# Patient Record
Sex: Male | Born: 1945 | Race: White | Hispanic: No | Marital: Married | Smoking: Current every day smoker
Health system: Southern US, Community
[De-identification: ages and names within clinical notes are randomized; demographics above are authoritative.]

## PROBLEM LIST (undated history)

## (undated) DIAGNOSIS — D759 Disease of blood and blood-forming organs, unspecified: Secondary | ICD-10-CM

## (undated) DIAGNOSIS — Z86718 Personal history of other venous thrombosis and embolism: Secondary | ICD-10-CM

## (undated) DIAGNOSIS — K91850 Pouchitis: Secondary | ICD-10-CM

## (undated) DIAGNOSIS — K2270 Barrett's esophagus without dysplasia: Secondary | ICD-10-CM

## (undated) DIAGNOSIS — R918 Other nonspecific abnormal finding of lung field: Principal | ICD-10-CM

## (undated) DIAGNOSIS — K219 Gastro-esophageal reflux disease without esophagitis: Secondary | ICD-10-CM

## (undated) DIAGNOSIS — J449 Chronic obstructive pulmonary disease, unspecified: Secondary | ICD-10-CM

## (undated) DIAGNOSIS — F419 Anxiety disorder, unspecified: Secondary | ICD-10-CM

## (undated) DIAGNOSIS — D469 Myelodysplastic syndrome, unspecified: Secondary | ICD-10-CM

## (undated) DIAGNOSIS — R0602 Shortness of breath: Secondary | ICD-10-CM

## (undated) DIAGNOSIS — N400 Enlarged prostate without lower urinary tract symptoms: Secondary | ICD-10-CM

## (undated) DIAGNOSIS — E538 Deficiency of other specified B group vitamins: Secondary | ICD-10-CM

## (undated) HISTORY — DX: Pouchitis: K91.850

## (undated) HISTORY — PX: KNEE SURGERY: SHX244

## (undated) HISTORY — DX: Other nonspecific abnormal finding of lung field: R91.8

## (undated) HISTORY — DX: Deficiency of other specified B group vitamins: E53.8

## (undated) HISTORY — DX: Personal history of other venous thrombosis and embolism: Z86.718

## (undated) HISTORY — DX: Barrett's esophagus without dysplasia: K22.70

## (undated) HISTORY — PX: COLECTOMY: SHX59

## (undated) HISTORY — DX: Myelodysplastic syndrome, unspecified: D46.9

---

## 1997-12-27 ENCOUNTER — Ambulatory Visit (HOSPITAL_COMMUNITY): Admission: RE | Admit: 1997-12-27 | Discharge: 1997-12-27 | Payer: Self-pay | Admitting: Internal Medicine

## 2001-02-06 ENCOUNTER — Inpatient Hospital Stay (HOSPITAL_COMMUNITY): Admission: EM | Admit: 2001-02-06 | Discharge: 2001-02-08 | Payer: Self-pay | Admitting: Emergency Medicine

## 2001-02-06 ENCOUNTER — Encounter: Payer: Self-pay | Admitting: Emergency Medicine

## 2001-12-01 ENCOUNTER — Encounter: Payer: Self-pay | Admitting: Urology

## 2001-12-01 ENCOUNTER — Encounter: Admission: RE | Admit: 2001-12-01 | Discharge: 2001-12-01 | Payer: Self-pay | Admitting: Urology

## 2002-02-23 HISTORY — PX: ABDOMINOPERINEAL PROCTOCOLECTOMY: SUR8

## 2002-06-09 ENCOUNTER — Ambulatory Visit (HOSPITAL_COMMUNITY): Admission: RE | Admit: 2002-06-09 | Discharge: 2002-06-09 | Payer: Self-pay | Admitting: Gastroenterology

## 2002-06-09 ENCOUNTER — Encounter (INDEPENDENT_AMBULATORY_CARE_PROVIDER_SITE_OTHER): Payer: Self-pay | Admitting: *Deleted

## 2002-11-19 ENCOUNTER — Emergency Department (HOSPITAL_COMMUNITY): Admission: EM | Admit: 2002-11-19 | Discharge: 2002-11-19 | Payer: Self-pay

## 2006-02-02 ENCOUNTER — Ambulatory Visit: Payer: Self-pay | Admitting: Gastroenterology

## 2006-03-10 ENCOUNTER — Ambulatory Visit: Payer: Self-pay | Admitting: Gastroenterology

## 2006-04-20 ENCOUNTER — Ambulatory Visit: Payer: Self-pay | Admitting: Gastroenterology

## 2006-10-11 ENCOUNTER — Ambulatory Visit: Payer: Self-pay | Admitting: Internal Medicine

## 2006-11-22 ENCOUNTER — Ambulatory Visit (HOSPITAL_COMMUNITY): Admission: RE | Admit: 2006-11-22 | Discharge: 2006-11-22 | Payer: Self-pay | Admitting: Gastroenterology

## 2006-11-22 ENCOUNTER — Encounter: Payer: Self-pay | Admitting: Gastroenterology

## 2006-11-22 ENCOUNTER — Ambulatory Visit: Payer: Self-pay | Admitting: Gastroenterology

## 2006-11-22 HISTORY — PX: COLONOSCOPY: SHX174

## 2006-11-30 ENCOUNTER — Ambulatory Visit: Payer: Self-pay | Admitting: Gastroenterology

## 2006-12-15 ENCOUNTER — Ambulatory Visit: Payer: Self-pay | Admitting: Gastroenterology

## 2007-01-05 ENCOUNTER — Ambulatory Visit: Payer: Self-pay | Admitting: Gastroenterology

## 2007-01-11 ENCOUNTER — Encounter (HOSPITAL_COMMUNITY): Admission: RE | Admit: 2007-01-11 | Discharge: 2007-02-10 | Payer: Self-pay | Admitting: Oncology

## 2007-01-11 ENCOUNTER — Ambulatory Visit (HOSPITAL_COMMUNITY): Payer: Self-pay | Admitting: Oncology

## 2007-02-21 ENCOUNTER — Encounter (HOSPITAL_COMMUNITY): Admission: RE | Admit: 2007-02-21 | Discharge: 2007-02-23 | Payer: Self-pay | Admitting: Oncology

## 2007-03-01 ENCOUNTER — Ambulatory Visit (HOSPITAL_COMMUNITY): Payer: Self-pay | Admitting: Oncology

## 2007-03-09 ENCOUNTER — Ambulatory Visit: Payer: Self-pay | Admitting: Gastroenterology

## 2007-04-19 ENCOUNTER — Ambulatory Visit: Payer: Self-pay | Admitting: Gastroenterology

## 2007-07-20 ENCOUNTER — Ambulatory Visit: Payer: Self-pay | Admitting: Gastroenterology

## 2007-10-25 DIAGNOSIS — D72819 Decreased white blood cell count, unspecified: Secondary | ICD-10-CM | POA: Insufficient documentation

## 2007-10-25 DIAGNOSIS — D696 Thrombocytopenia, unspecified: Secondary | ICD-10-CM | POA: Insufficient documentation

## 2007-10-25 DIAGNOSIS — K5190 Ulcerative colitis, unspecified, without complications: Secondary | ICD-10-CM | POA: Insufficient documentation

## 2007-10-25 DIAGNOSIS — D126 Benign neoplasm of colon, unspecified: Secondary | ICD-10-CM

## 2007-10-25 DIAGNOSIS — D649 Anemia, unspecified: Secondary | ICD-10-CM

## 2007-10-25 DIAGNOSIS — Z87898 Personal history of other specified conditions: Secondary | ICD-10-CM

## 2007-10-25 DIAGNOSIS — Z86718 Personal history of other venous thrombosis and embolism: Secondary | ICD-10-CM | POA: Insufficient documentation

## 2007-10-25 DIAGNOSIS — R197 Diarrhea, unspecified: Secondary | ICD-10-CM

## 2007-10-27 DIAGNOSIS — F17200 Nicotine dependence, unspecified, uncomplicated: Secondary | ICD-10-CM | POA: Insufficient documentation

## 2008-01-30 ENCOUNTER — Ambulatory Visit: Payer: Self-pay | Admitting: Gastroenterology

## 2008-09-18 ENCOUNTER — Ambulatory Visit: Payer: Self-pay | Admitting: Gastroenterology

## 2008-09-18 DIAGNOSIS — K91850 Pouchitis: Secondary | ICD-10-CM

## 2008-10-12 ENCOUNTER — Encounter: Payer: Self-pay | Admitting: Urgent Care

## 2008-10-15 ENCOUNTER — Encounter (INDEPENDENT_AMBULATORY_CARE_PROVIDER_SITE_OTHER): Payer: Self-pay | Admitting: *Deleted

## 2009-09-04 ENCOUNTER — Encounter (INDEPENDENT_AMBULATORY_CARE_PROVIDER_SITE_OTHER): Payer: Self-pay

## 2009-10-18 ENCOUNTER — Telehealth (INDEPENDENT_AMBULATORY_CARE_PROVIDER_SITE_OTHER): Payer: Self-pay

## 2009-12-27 ENCOUNTER — Ambulatory Visit (HOSPITAL_COMMUNITY)
Admission: RE | Admit: 2009-12-27 | Discharge: 2009-12-27 | Payer: Self-pay | Source: Home / Self Care | Admitting: Urology

## 2010-02-13 ENCOUNTER — Telehealth (INDEPENDENT_AMBULATORY_CARE_PROVIDER_SITE_OTHER): Payer: Self-pay

## 2010-02-23 DIAGNOSIS — K2270 Barrett's esophagus without dysplasia: Secondary | ICD-10-CM

## 2010-02-23 DIAGNOSIS — K91850 Pouchitis: Secondary | ICD-10-CM

## 2010-02-23 HISTORY — DX: Barrett's esophagus without dysplasia: K22.70

## 2010-02-23 HISTORY — DX: Pouchitis: K91.850

## 2010-03-03 ENCOUNTER — Encounter: Payer: Self-pay | Admitting: Gastroenterology

## 2010-03-03 ENCOUNTER — Ambulatory Visit
Admission: RE | Admit: 2010-03-03 | Discharge: 2010-03-03 | Payer: Self-pay | Source: Home / Self Care | Attending: Gastroenterology | Admitting: Gastroenterology

## 2010-03-04 ENCOUNTER — Encounter: Payer: Self-pay | Admitting: Gastroenterology

## 2010-03-07 ENCOUNTER — Encounter: Payer: Self-pay | Admitting: Internal Medicine

## 2010-03-07 LAB — CONVERTED CEMR LAB
Alkaline Phosphatase: 50 units/L (ref 39–117)
BUN: 14 mg/dL (ref 6–23)
Eosinophils Absolute: 0 10*3/uL (ref 0.0–0.7)
Eosinophils Relative: 0 % (ref 0–5)
Glucose, Bld: 125 mg/dL — ABNORMAL HIGH (ref 70–99)
HCT: 34.4 % — ABNORMAL LOW (ref 39.0–52.0)
Lymphs Abs: 0.8 10*3/uL (ref 0.7–4.0)
MCHC: 31.4 g/dL (ref 30.0–36.0)
MCV: 113.5 fL — ABNORMAL HIGH (ref 78.0–100.0)
Platelets: 113 10*3/uL — ABNORMAL LOW (ref 150–400)
Sodium: 137 meq/L (ref 135–145)
Total Bilirubin: 0.6 mg/dL (ref 0.3–1.2)
Total Protein: 7.7 g/dL (ref 6.0–8.3)
WBC: 2.5 10*3/uL — ABNORMAL LOW (ref 4.0–10.5)

## 2010-03-14 ENCOUNTER — Ambulatory Visit (HOSPITAL_COMMUNITY)
Admission: RE | Admit: 2010-03-14 | Discharge: 2010-03-14 | Payer: Self-pay | Source: Home / Self Care | Attending: Internal Medicine | Admitting: Internal Medicine

## 2010-03-14 HISTORY — PX: ESOPHAGOGASTRODUODENOSCOPY: SHX1529

## 2010-03-14 HISTORY — PX: ILEOSCOPY: SHX311

## 2010-03-25 NOTE — Op Note (Signed)
NAME:  Mark Morrison, Mark Morrison                ACCOUNT NO.:  [id number]  MEDICAL RECORD NO.:  [id number]          PATIENT TYPE:  AMB  LOCATION:  DAY                           FACILITY:  [facility]  PHYSICIAN:  R. Roetta Sessions, M.D. DATE OF BIRTH:  [birth year]  DATE OF PROCEDURE:  03/14/2010 DATE OF DISCHARGE:                              OPERATIVE REPORT   PROCEDURE:  Ileoscopy with ileal biopsies followed esophagogastroduodenoscopy with esophageal biopsies.  INDICATIONS FOR PROCEDURE:  A 65 year old gentleman with ileal pouch- anal anastomosis after proctocolectomy for ulcerative colitis.  She has had intermittent chronic diarrhea, history of pouchitis.  He has had transient response to antibiotics over the years.  He is now pancytopenic.  He has had some weight loss recently as well.  Recent course of Augmentin and Flagyl through our office, it has been associated with transient improvement in his symptoms.  Ileoscopy and biopsy is appropriate followed by EGD now being done to further evaluate his symptoms.  Risks, benefits, limitations, alternatives, and imponderables have been discussed previously and again did at the bedside.  All parties questions were answered, all parties agreeable.  PROCEDURE NOTE:  O2 saturation, blood pressure, pulse, and respirations monitored throughout the entirety of both procedures.  CONSCIOUS SEDATION:  Versed 5 mg IV and Demerol 125 mg IV in divided doses.  Cetacaine spray for topical pharyngeal anesthesia.  INSTRUMENT:  Pentax video chip system.  ILEO FINDINGS:  Digital rectal exam revealed no abnormalities. Endoscopic findings:  Prep was adequate.  Anastomosis with the ileum just inside the anal verge.  There was geographic ulceration noted in the ileal mucosa at the level of patch extending proximal to 4 cm.  The ileal mucosa appeared entirely normal.  Advanced the scope probably up to 40 cm.  From this level, scope was withdrawn.  All  previously mentioned mucosal surfaces were again seen.  Again the inflation was localized to the pouch.  Please see photos.  Biopsies were taken.  The patient tolerated this procedure well and was prepared for EGD. Examination of the tubular esophagus revealed salmon-colored epithelium coming up until the level of 27 cm from incisors consistent with Barrett esophagus.  There was no esophagitis.  No neoplasm.  Tubular esophagus was patent through the EG junction.  STOMACH:  Gastric cavity was emptied and insufflated well with air. Thorough examination of the gastric mucosa including retroflexed proximal stomach esophagogastric junction demonstrated no abnormalities. Pylorus was patent, easily traversed.  Examination of the bulb second portion revealed diffusely eroded duodenal bulbar mucosa with some nodularity appear to be benign process, see photos.  Otherwise D1-D2 appeared unremarkable.  Therapeutic/diagnostic maneuvers performed. Segmental biopsies from 29, 31, 33, and 35 cm from the incisors were taken of the esophageal mucosa.  The patient tolerated both procedures well and was reacted in endoscopy.  IMPRESSION: 1. Salmon-colored epithelium coming out into the distal esophagus     consistent with Barrett esophagus, status post biopsy. 2. Small hiatal hernia, otherwise normal stomach. 3. Erosive duodenitis (bulb).  Ileoscopy findings, geographic     ulcerations of the ileal pouch consistent with pouchitis, more  proximal distal ileum mucosa appeared entirely normal, status post     biopsy.  RECOMMENDATIONS: 1. We will follow up on path. 2. Further management and recommendations to follow in the very near     future.     Mark Morrison, M.D.     RMR/MEDQ  D:  03/14/2010  T:  03/14/2010  Job:  [id number]  cc:   Soyla Murphy. [name], M.D. Fax: [phone]  Electronically Signed by Lorrin Goodell M.D. on 03/25/2010 01:39:02 PM

## 2010-03-25 NOTE — Progress Notes (Signed)
Summary: phone note/diarrhea  Phone Note Call from Patient   Caller: Patient Summary of Call: Pt's wife called and said she feels he is having a "bacterial" infection. He is having diarrhea alot. She wanted to know if  Dr. Darrick Penna would call something in. I told her i would pass the request to Dr. Darrick Penna. She has not made an appt, because  he likes for her to come when he comes, and when she tries to get appt when she is off, there are none available or Dr. Darrick Penna is not here that day. Please advise! Initial call taken by: Cloria Spring LPN,  October 18, 2009 4:14 PM     Appended Document: phone note/diarrhea Please call pt. He needs to be seen. Call in AUGmentin 500 mg two times a day for 5 days and Flagyl 500 mg three times a day for 5 days, rfx0. Pt may continue to use Hyomax. Needs f/u in 4-6 weeks regardless of provider avaialibility. Med side effects include loose stools, red rash, nausea, or vomtiing. Avoid alcohol, even cough syrup.  Appended Document: phone note/diarrhea Pt's wife informed. Rx called and LMOM @ [facility].

## 2010-03-25 NOTE — Letter (Signed)
Summary: Recall Office Visit  [facility]  [address]   [place], [place] [address]   Phone: [phone]  Fax: [phone]      September 04, 2009   JOHNATHEN TESTA [address] [place], [place]  [address] October 13, 1945   Dear Mr. GRASSIA,   According to our records, it is time for you to schedule a follow-up office visit with [place].   At your convenience, please call [phone] to schedule an office visit. If you have any questions, concerns, or feel that this letter is in error, we would appreciate your call.   Sincerely,    Hendricks Limes LPN  [facility] Ph: [phone]   Fax: [phone]

## 2010-03-27 NOTE — Assessment & Plan Note (Signed)
Summary: CBC AN CMP YEARLY/LAW   Visit Type:  Follow-up Visit Primary Care Provider:  Merri Brunette, M.D.  CC:  diarrhea.  History of Present Illness: Mark Morrison is here for a routine yearly follow-up. Last seen July 2010. Scheduled for yearly CBC, CMP. This is due now. Has hx of UC requiring proctocolectomy with ileal anastamosis and anal pouch creation in 2004.  Has hx of chronic pouchitis; Pouchitis/Ileitis found on ileoscopy-2008. Usually has bouts with diarrhea every 6 mos, requiring short course of abx. Has had best outcome with Augmentin/Flagyl. Per last note, may require rounds of abx as needed  if increased diarrhea. Baseline stools 3-5 per day. Missed yearly office visit, called into office with c/o increased diarrhea.  States diarrhea has exacerbated 10-15X in whole day. 2-3 at night. No blood noted. Denies abdominal pain. Afebrile. Is not taking hyomax anymore, stopped in the summer. 188 July 2010, today 169. No loss of appetite. Denies N/V.   Current Medications (verified): 1)  B12 Injection Every 3 Months 2)  Adult Aspirin Ec Low Strength 81 Mg Tbec (Aspirin) .... Take 1 Tablet By Mouth Once A Day 3)  Multi-Day Vitamins  Tabs (Multiple Vitamin) .... Take 1 Tablet By Mouth Once A Day 4)  Fish Oil  Oil (Fish Oil) .... Take 1 Tablet By Mouth Once A Day  Allergies (verified): No Known Drug Allergies  Past History:  Past Medical History: Last updated: 09/18/2008 Ulcerative Colitis with IPAA 2004 Pouchitis/Ileitis on ileoscopy-2008 B12 Deficiency BPH Hx:DVT/PE  Past Surgical History: Last updated: 10/25/2007 LEFT KNEE SURGERY PROCTOCOLECTOMY 2004  Review of Systems General:  Denies fever, chills, and anorexia. Eyes:  Denies blurring, irritation, and discharge. ENT:  Denies sore throat, hoarseness, and difficulty swallowing. CV:  Denies chest pains and syncope. Resp:  Denies dyspnea at rest and wheezing. GI:  Complains of diarrhea; denies difficulty swallowing, pain on  swallowing, nausea, abdominal pain, bloody BM's, and black BMs. GU:  Denies urinary burning and urinary frequency. MS:  Denies joint pain / LOM, joint swelling, and joint stiffness. Derm:  Denies rash, itching, and dry skin. Neuro:  Denies weakness and syncope. Psych:  Denies depression and anxiety. Endo:  Denies cold intolerance and heat intolerance. Heme:  Denies bruising and bleeding.  Vital Signs:  Patient profile:   65 year old male Height:      67 inches Weight:      169 pounds BMI:     26.56 Temp:     97.6 degrees F oral Pulse rate:   88 / minute BP sitting:   110 / 80  (left arm) Cuff size:   regular  Vitals Entered By: Hendricks Limes LPN (March 03, 2010 8:32 AM)  Physical Exam  General:  Well developed, well nourished, no acute distress. Head:  Normocephalic and atraumatic. Eyes:  sclera without icterus Mouth:  No deformity or lesions, dentition normal. Lungs:  Clear throughout to auscultation. Heart:  Regular rate and rhythm; no murmurs, rubs,  or bruits. Abdomen:  +BS, well-healed surgical scars, no rebound or guarding, no tenderness or distention. No HSM. Msk:  Symmetrical with no gross deformities. Normal posture. Extremities:  No clubbing, cyanosis, edema or deformities noted. Neurologic:  Alert and  oriented x4;  grossly normal neurologically. Skin:  Intact without significant lesions or rashes. Psych:  Alert and cooperative. Normal mood and affect.  Impression & Recommendations:  Problem # 1:  POUCHITIS (ICD-26.72)  65 year old male with hx of UC requiriing proctocolectomy and ileal anastamosis, pouch creation.  chronic pouchitis requiring short course of abx, responds best to Augmentin and Flagyl. Usually goes about 6 mos without any flares. Now with increased stool 10-15 per day. No brbpr, no melena. No abdominal pain, N/V. Wt slightly down at 169 today, was 188 July 2010. Due for CBC, CMP  Augmentin 500 mg by mouth two times a day X 5 days Flagyl 500 mg  by mouth three times a day X 5 days CBC, CMP Return in 3 mos for wt check, evaluation Instructed to call office if frequency of stools does not resolve with abx. Pt aware  Orders: Est. Patient Level II (45409)  Other Orders: T-CBC w/Diff ([id number]) T-Comprehensive Metabolic Panel [phone])

## 2010-03-27 NOTE — Progress Notes (Signed)
Summary: rx request  Phone Note Call from Patient Call back at Home Phone [phone]   Caller: Spouse Summary of Call: pts wife called- pt is having diarrhea again and is requesting Rx for augmentin and flagyl called to [facility]. pt has ov appt for 03/03/10. informed pts wife that he did not return in Sept, 2011 as [facility] recommended and was not sure if we would be able to call in Rx or not. please advise Initial call taken by: Hendricks Limes LPN,  February 13, 2010 12:09 PM     Appended Document: rx request we will hold of on prescribing abx, as pt hasn't been seen as requested. if abdominal pain, fever, worsening of diarrhea,needs to go to ED.  Appended Document: rx request pts wife aware

## 2010-03-27 NOTE — Letter (Signed)
Summary: TCS/EGD ORDER  TCS/EGD ORDER   Imported By: Ave Filter 03/07/2010 09:10:00  _____________________________________________________________________  External Attachment:    Type:   Image     Comment:   External Document

## 2010-03-27 NOTE — Miscellaneous (Signed)
Summary: Orders Update  Clinical Lists Changes  Orders: Added new Test order of T-CBC w/Diff [phone]) - Signed Added new Test order of T-Comprehensive Metabolic Panel [id number]) - Signed

## 2010-04-22 ENCOUNTER — Ambulatory Visit (INDEPENDENT_AMBULATORY_CARE_PROVIDER_SITE_OTHER): Payer: Self-pay | Admitting: Urgent Care

## 2010-04-22 ENCOUNTER — Encounter: Payer: Self-pay | Admitting: Urgent Care

## 2010-04-22 DIAGNOSIS — K91850 Pouchitis: Secondary | ICD-10-CM

## 2010-04-22 DIAGNOSIS — K2270 Barrett's esophagus without dysplasia: Secondary | ICD-10-CM

## 2010-04-22 DIAGNOSIS — R197 Diarrhea, unspecified: Secondary | ICD-10-CM

## 2010-05-01 NOTE — Assessment & Plan Note (Signed)
Summary: ov w/us in about 4 weeks/barretts esophagus   Vital Signs:  Patient profile:   65 year old male Height:      67 inches Weight:      170 pounds BMI:     26.72 Temp:     97.3 degrees F oral Pulse rate:   88 / minute BP sitting:   128 / 78  (left arm)  Vitals Entered By: Carolan Clines LPN (April 22, 2010 11:04 AM)   Visit Type:  Follow-up Visit Primary Care Provider:  Merri Brunette, MD  Chief Complaint:  UC/Barrett's.  History of Present Illness: 65 year old gentleman who had ulcerative colitis and subsequently underwent a proctocolectomy with ileal anastomosis and anal pouch creation.  He does have a history of pouchitis and ileitis.  He has done very well with periodic use of Augmentin. Recent colonoscopy & EGD showed pouchitis, erosive duodenitis & Barrett's esophagus.  c/o diarrhea evey 2 hrs, 2-3 episodes at night.   Denies any abd pain, fever or chills.  Denies any rectal bleeding or melena.  Denies anorexia or wt loss.  Completed canasa x 1 mo-no help.  Tried imodium-no help.  Uses hyoscyamine as needed.  Current Medications (verified): 1)  B12 Injection Every 3 Months 2)  Adult Aspirin Ec Low Strength 81 Mg Tbec (Aspirin) .... Take 1 Tablet By Mouth Once A Day 3)  Multi-Day Vitamins  Tabs (Multiple Vitamin) .... Take 1 Tablet By Mouth Once A Day 4)  Fish Oil  Oil (Fish Oil) .... Take 1 Tablet By Mouth Once A Day 5)  Flomax 0.4 Mg Caps (Tamsulosin Hcl) .... Take One At Bedtime 6)  Hyomax 0.125 Mg Tabs (Hyoscyamine Sulfate) .Marland Kitchen.. 1 By Mouth Qid As Needed Diarrhea 7)  Metronidazole 500 Mg Tabs (Metronidazole) .Marland Kitchen.. 1 By Mouth Three Times A Day X 10 Days  Allergies (verified): No Known Drug Allergies  Past History:  Past Medical History: Ulcerative Colitis with IPAA 2004 Pouchitis/Ileitis on ileoscopy-02/2010 B12 Deficiency BPH Hx:DVT/PE Barrett's esophagus on EGD 02/2010  Past Surgical History: Reviewed history from 10/25/2007 and no changes required. LEFT KNEE  SURGERY PROCTOCOLECTOMY 2004  Review of Systems      See HPI General:  Denies fever, chills, sweats, anorexia, fatigue, weakness, malaise, weight loss, and sleep disorder. CV:  Denies chest pains, angina, palpitations, syncope, dyspnea on exertion, orthopnea, PND, peripheral edema, and claudication. Resp:  Denies dyspnea at rest, dyspnea with exercise, cough, sputum, wheezing, coughing up blood, and pleurisy. GI:  See HPI. GU:  Denies urinary burning, blood in urine, urinary frequency, urinary hesitancy, nocturnal urination, and urinary incontinence. MS:  Denies joint pain / LOM, joint swelling, joint stiffness, joint deformity, low back pain, muscle weakness, muscle cramps, muscle atrophy, leg pain at night, leg pain with exertion, and shoulder pain / LOM hand / wrist pain (CTS). Derm:  Denies rash, itching, dry skin, hives, moles, warts, and unhealing ulcers. Psych:  Denies depression, anxiety, memory loss, suicidal ideation, hallucinations, paranoia, phobia, and confusion. Heme:  Denies bruising, bleeding, and enlarged lymph nodes.  Physical Exam  General:  Well developed, well nourished, no acute distress. Head:  Normocephalic and atraumatic. Eyes:  sclera without icterus Mouth:  No deformity or lesions, dentition normal. Neck:  Supple; no masses or thyromegaly. Heart:  Regular rate and rhythm; no murmurs, rubs,  or bruits. Abdomen:  normal bowel sounds, without guarding, without rebound, no masses, and no hepatomegally or splenomegaly.   Msk:  Symmetrical with no gross deformities. Normal posture. Extremities:  No clubbing, cyanosis, edema or deformities noted. Neurologic:  Alert and  oriented x4;  grossly normal neurologically. Skin:  Intact without significant lesions or rashes. Psych:  Alert and cooperative. Normal mood and affect.   Impression & Recommendations:  Problem # 1:  POUCHITIS (ICD-569.71) 65 y/o caucasian male w/ hx UC & recent ileocolonoscopy w/ pouchitis w/  minimal response to canasa.  Trial flagyl 500mg  three times a day x 10 days to see if improvement in diarrhea.   Orders: Est. Patient Level III ([address]109)  Problem # 2:  BARRETTS ESOPHAGUS (ICD-530.85) Found on recent EGD.  On PPI. Repeat EGD 1 yr  Problem # 3:  DIARRHEA (ICD-787.91) Secondary to UC/pouchitis  Patient Instructions: 1)  EGD 02/2011 FU Barrett's 2)  Continue protonix 40mg /day 3)  Please schedule a follow-up appointment in 6months or sooner if diarrhea no better Prescriptions: METRONIDAZOLE 500 MG TABS (METRONIDAZOLE) 1 by mouth three times a day x 10 days  #30 x 0   Entered and Authorized by:   Joselyn Arrow FNP-BC   Signed by:   Joselyn Arrow FNP-BC on 04/22/2010   Method used:   Electronically to        [facility]  [address] (retail)       [address]       [place], [place]  [address]       Ph: [phone]       Fax: [phone]   RxID:   [id number] HYOMAX 0.125 MG TABS (HYOSCYAMINE SULFATE) 1 by mouth qid as needed diarrhea  #120 x 5   Entered and Authorized by:   Joselyn Arrow FNP-BC   Signed by:   Joselyn Arrow FNP-BC on 04/22/2010   Method used:   Electronically to        [facility]  [address] (retail)       [address]       [place], [place]  [address]       Ph: [phone]       Fax: [phone]   RxID:   [id number]    Orders Added: 1)  Est. Patient Level III [60630]  Appended Document: ov w/us in about 4 weeks/barretts esophagus reminder in epic

## 2010-07-08 NOTE — Assessment & Plan Note (Signed)
NAMEMarland Kitchen  Mark Morrison, Mark Morrison                 CHART#:  [id number]   DATE:                                   DOB:  January 25, 1946   PROBLEM:  1. B12 deficiency, followed by Dr. Ladona Horns. [place].  2. History of ileal pouch with anal anastomosis at [facility] in      2004.  3. Chronic loose stools, likely secondary to pouchitis.  4. Ileostomy performed in September 2008, which showed chronic colitis      with surface erosion at the ileoanal anastomosis.  In the terminal      ileum there was no cryptitis or crypt abscess.  An ulcer at the      anastomosis showed crypt distortion.  The anal pouch showed chronic      pouchitis.  5. Benign prostatic hypertrophy.  6. History of pulmonary embolism and deep venous thrombosis.   SUBJECTIVE:  Mark Morrison is a 65 year old male who has improved somewhat  with Pentasa.  The Pentasa is too expensive.  He is having eight to nine  bowel movements a day sometimes.  They are small volume.  The codeine  makes his right leg hurt and his toes numb.  He really has only been  taking his hyoscyamine three times daily.  He rarely has nocturnal  stools.  He denies any fever.  His appetite is good.   MEDICATIONS:  1. Terazosin 2 mg twice daily.  2. Levsin three times daily.  3. Baby aspirin daily.  4. B12 injections.  5. Multivitamin.  6. Fish oil.  7. Pentasa four times daily, but his prescription is out.   PHYSICAL EXAMINATION:  VITAL SIGNS:  Weight 178 pounds (up 2 pounds  since October 2008.)  Height is 5 feet 7 inches, temperature 97.9  degrees, blood pressure 112/78, pulse 88.  GENERAL:  He is in no apparent distress.  Alert and oriented x4.  LUNGS:  Clear to auscultation bilaterally.  CARDIOVASCULAR:  A regular rhythm.  No murmurs.  ABDOMEN:  Bowel sounds present, soft, nontender, nondistended.   ASSESSMENT:  Mark Morrison is a 65 year old male with chronic pouchitis.  He  responded somewhat to Augmentin, but may have a better response from his  pouchitis to  Flagyl.  It is also less expensive. Thank you for allowing  me to see Mark Morrison in consultation.   RECOMMENDATIONS:  1. Add Flagyl 500 mg three times daily for 10 days.  2. He is also given a prescription for Terazosin 2 mg, one p.o. twice      daily.  He is given no refills.  3. He is asked to see his primary care physician for additional      refills.  4. Refilled his hyoscyamine.  5. Return patient visit in one month.       Kassie Mends, M.D.  Electronically Signed     SM/MEDQ  D:  03/09/2007  T:  03/09/2007  Job:  [id number]   cc:   Mark Morrison, M.D.

## 2010-07-08 NOTE — Assessment & Plan Note (Signed)
NAMEMarland Kitchen  Mark Morrison, Mark Morrison                 CHART#:  [id number]   DATE:  11/30/2006                       DOB:  09/16/1945   REASON FOR VISIT:  Follow up of procedure.   SUBJECTIVE:  The patient is a pleasant, 65 year old gentleman with a  history of ulcerative colitis, status post proctocolectomy with ileal  pouch-anal anastomosis.  He presents with complaints of persistent  diarrhea.  He had a flexible sigmoidoscopy on November 22, 2006 by Dr.  Kassie Mends, which revealed a few scattered ulcers in the neo-terminal  ileum, ulcerative erythematous lesions at the ileoanal anastomosis,  shallow ulcers in the anal pouch.  The biopsies came back with chronic  ileitis, chronic colitis, and chronic pouchitis.  The patient is having  4-5 stools every evening beginning around 3 p.m. to midnight.  He then,  again, gets up around 2 a.m. and 4 a.m. every night to have another  loose stool.  He denies any blood in the stool.  He is not having any  abdominal pain.  No nausea or vomiting or heartburn.  His appetite is  good.   CURRENT MEDICATIONS:  see updated list.  He has been on the mesalamine  enemas just for 3 evenings.   ALLERGIES:  No known drug allergies.   PHYSICAL EXAM:  VITAL SIGNS:  Weight 172.5, temperature 97.8, blood  pressure 122/80, pulse 84.  GENERAL:  A pleasant, well-nourished well-developed Caucasian male in no  acute distress.  SKIN:  Warm and dry no jaundice.  HEENT:  Sclerae nonicteric, oropharyngeal mucosa moist and pink.  ABDOMEN:  Positive bowel sounds.  Abdomen soft, nontender, nondistended.  No organomegaly or masses.  No rebound tenderness or guarding.   IMPRESSION:  Mark Morrison is a 65 year old gentleman status post ileal  pouch-anal anastomosis for ulcerative colitis.  He has chronic diarrhea  which is persisting.  Recent flexible sigmoidoscopy as outlined above.  His diarrhea may be due to chronic pouchitis versus ileitis due to IBD.  Given that he has a finding in  his ileum, a question of the possibility  of Crohn's.  I have discussed biopsies with Dr. Kassie Mends.  The  following plan has been outlined.   PLAN:  1. We will hold on oral mesalamine therapy at this time.  We will      continue mesalamine enemas for at least two to three weeks.  We      will have him return to the office at that point in time, and      discuss for any change in his therapy.  We will go ahead and check      a B12 and CMET for baseline.  2. Office visit with Dr. Kassie Mends in 4 weeks.  If no significant      improvement in the next couple of weeks, could consider a trial of      Pentasa.       Tana Coast, P.A.  Electronically Signed     Kassie Mends, M.D.  Electronically Signed    LL/MEDQ  D:  11/30/2006  T:  12/01/2006  Job:  [id number]

## 2010-07-08 NOTE — H&P (Signed)
NAME:  Mark Morrison, Mark Morrison                ACCOUNT NO.:  [id number]   MEDICAL RECORD NO.:  [id number]         PATIENT TYPE:  AMB   LOCATION:                                FACILITY:  [facility]   PHYSICIAN:  Kassie Mends, M.D.      DATE OF BIRTH:  1945/03/30   DATE OF ADMISSION:  10/01/2006  DATE OF DISCHARGE:  LH                              HISTORY & PHYSICAL   CHIEF COMPLAINT:  Chronic diarrhea, status post proctocolectomy.   HISTORY OF PRESENT ILLNESS:  Mark Morrison is a 65 year old Caucasian male  who has history of ulcerative colitis and status post partial colectomy  with ileal pouch and ileal anal anastomosis.  He initially came to see  Dr. Cira Servant December 2007.  He has been having chronic diarrhea off and on  since that time.  He has used a couple courses of Flagyl and amoxicillin  which do seem to help temporarily.  He has not been on antibiotics for 6  months now.  He tells me usually in the evenings between 4:00-8:00 p.m.  he could have upwards of 10 bowel movements a day.  He says his symptoms  are worse with ice cream, gravy, and chocolate cake.  He denies any  anorexia.  Denies any rectal bleeding or melena.  His weight is down 6  pounds in the last 9 months.  He was scheduled for a flexible  sigmoidoscopy and began to feel better around February this year.  He  therefore cancelled the procedure.   PAST MEDICAL HISTORY:  He has history of ulcerative colitis status post  proctocolectomy with ileal pouch and ileal anal anastomosis.  He has  history of BPH, pulmonary embolus and DVT.  He has history of multiple  colon polyps prior to surgery.  He has had left knee surgery.   CURRENT MEDICATIONS:  1. Terazosin 2 mg b.i.d.  2. Levsin 0.125 mg q.i.d.  3. Tincture of Opium 1 mL t.i.d.  4. Baby aspirin 81 mg daily.   ALLERGIES:  NO KNOWN DRUG ALLERGIES.   FAMILY HISTORY:  No known family history of colorectal carcinoma or  chronic liver or GI problems.   SOCIAL HISTORY:  He is married,  has two children.  He is retired from  [employer].  He denies any alcohol or drug use.  He does  smoke a pack of cigarettes daily.   REVIEW OF SYSTEMS:  See HPI, otherwise negative.   PHYSICAL EXAMINATION:  VITAL SIGNS:  Weight 178 pounds, height 67  inches, temperature 97.8, blood pressure 120/78, pulse 60.  GENERAL:  Mark Morrison is a well-developed, well-nourished Caucasian male  in no acute distress.  He is accompanied by his wife today.  HEENT:  Sclerae are clear, nonicteric.  Conjunctivae pink.  Oropharynx  pink and moist without lesions.  NECK:  Supple without any mass or thyromegaly.  CHEST:  Heart regular rate and normal S1-S2 no murmurs, clicks, rubs or  gallops.  LUNGS:  Clear to auscultation bilaterally.  ABDOMEN:  Positive bowel sounds x4.  No bruits auscultated.  He has  vertical  and horizontal well-healed previous scars from abdominal  surgeries.  Abdomen is soft, nontender, nondistended without palpable  mass or hepatosplenomegaly.  No rebound tenderness or guarding.  EXTREMITIES:  With clubbing.  There is no edema.  SKIN:  Pink, warm and dry without any rash or jaundice.   IMPRESSION:  Mark Morrison is a 65 year old Caucasian male who is status  post ileal pouch anal anastomosis given history of ulcerative colitis  and multiple polyps.  He has had chronic diarrhea which has not  responded to tincture of opium and/or Levsin.  He does have intermittent  relief with antibiotics making it possible that he could have bacterial  overgrowth and/or pouchitis.  He is going to require further evaluation  by Dr. Cira Servant.   PLAN:  1. Flexible sigmoidoscopy with Dr. Cira Servant in the near future.  I have      discussed the procedure including risks and benefits which include      but are not limited to bleeding, infection, perforation, drug      reaction.  He agrees with plan and consent will be obtained.  2. Tincture of opium 1 mm q.i.d. p.r.n. diarrhea in quantity       sufficient for a month with no refills.  3. He will have two tap water enemas and endoscopy prior to flexible      sigmoidoscopy.  4. He can continue Levsin p.r.n.  5. He has amoxicillin and Flagyl on hand, if necessary.   ADDENDDUM: MANUS:  Consider treatment for bacterial overgrowth if Flex  Sig unremarkable and low fat diet. Low likelihood of celiac sprue.      Lorenza Burton, N.P.      Kassie Mends, M.D.  Electronically Signed    KJ/MEDQ  D:  10/11/2006  T:  10/12/2006  Job:  [id number]   cc:   Soyla Murphy. [name], M.D.  Fax: [phone]

## 2010-07-08 NOTE — Assessment & Plan Note (Signed)
NAMEMarland Kitchen  Mark Morrison, Mark Morrison                 CHART#:  [id number]   DATE:  01/30/2008                       DOB:  1945/05/04   PROBLEM LIST:  1. Chronic loose stool following proctocolectomy in 2004, with      evidence of pouchitis and ileitis on ileoscopy in September 2008.  2. B12 deficiency.  3. Benign prostatic hypertrophy.  4. History of pulmonary embolus and deep vein thrombosis.   SUBJECTIVE:  The patient is a 65 year old gentleman who presents for a  return visit.  He was last seen on 07/20/2007.  He has a history of  chronic loose stools with history of chronic pouchitis, which has  responded to both Augmentin and Flagyl previously.  He has also been on  other medications including Pentasa which he felt was too expensive,  Rowasa enemas, and Canasa suppositories, which he felt did not help.  He  has taken codeine in the past for loose stools with result as well as  hyoscyamine.  He tells me since his last office visit, he has used  Augmentin on 3 occasions.  Overall, he has been feeling well.  He states  if he eats certain things he may have increased number of stools.  Generally, he has about 3-5 stools a day.  He may have about 3 during  the day and sometimes 2-3 at night.  Denies any nocturnal diarrhea.  Denies any abdominal pain.  No melena or rectal bleeding.  Stools are  generally soft to loose.  He takes hyoscyamine about 2 times daily.  He  notes it does seem to help.   CURRENT MEDICATIONS:  1. B12 every 3 months.  2. Aspirin 81 mg daily.  3. Multivitamin daily.  4. Fish oil daily.  5. Hyoscyamine 0.125 mg sublingual b.i.d.  6. Terazosin 5 mg daily.   ALLERGIES:  No known drug allergies.   PHYSICAL EXAMINATION:  VITAL SIGNS:  Weight 187, up 7 pounds; height 5  feet 7 inches; temp 98.1; blood pressure 100/82; and pulse 80.  GENERAL:  A pleasant well-nourished, well-developed Caucasian gentleman  in no acute distress.  SKIN:  Warm and dry.  No jaundice.  HEENT:   Sclerae nonicteric.  Oropharyngeal mucosa moist and pink.  CHEST:  Lungs are clear to auscultation.  CARDIAC:  Regular rate and rhythm.  No murmurs.  ABDOMEN:  Positive bowel sounds.  Abdomen is soft, nontender, and  nondistended.  No organomegaly or masses.  No rebound or guarding.  No  abdominal bruits or hernias.  Abdominal incision well healed.   IMPRESSION:  The patient is a 65 year old gentleman who had ulcerative  colitis and subsequently underwent a proctocolectomy with ileal  anastomosis and anal pouch creation.  He does have a history of  pouchitis and ileitis.  He has done very well with periodic use of  Augmentin.   PLAN:  1. He may continue to use Augmentin 500 mg b.i.d. for 5 days, but not      to use more than once a month.  Prescription for #30, 2 refills      provided from mail order pharmacy.  2. HyoMax 1 sublingual q.a.c., bedtime up to 4 times a day as needed      for diarrhea, 90-day supply, 3 refills provided.  3. Office visit in 6 months with Dr.  Manus or sooner if needed.       Tana Coast, P.A.  Electronically Signed     Kassie Mends, M.D.  Electronically Signed    LL/MEDQ  D:  01/30/2008  T:  01/30/2008  Job:  [id number]   cc:   Soyla Murphy. [name], M.D.

## 2010-07-08 NOTE — Assessment & Plan Note (Signed)
NAME:  Mark Morrison, Mark Morrison                 CHART#:  [id number]   DATE:                                   DOB:  [birth year]   REFERRING PHYSICIAN:  Merri Morrison.   PROBLEM Morrison:  1. B12 deficiency with level measured at 179 (211-911).  2. History of ileal pouch with anal anastomosis at [facility] in      2004.  3. Chronic loose stool since procedure requiring the use of opium.  4. Ileoscopy performed in September of 2008 which showed chronic      colitis with surface erosion at the ileoanal anastomosis, in the      neoterminal ileum, and in the anal pouch.  In the neoterminal      ileum, there was no definite evidence of cryptitis or crypt      abscess.  At the ileoanal anastomosis, the ulcer showed crypt      distortion but no cryptitis or crypt abscess was identified.  The      anal pouch biopsy showed chronic pouchitis.  5. Benign prostate hypertrophy.  6. History of PE and DVT.   SUBJECTIVE:  Mark Morrison is a 65 year old male who presents as a return  patient visit.  He was last seen by me in September 2008.  He was seen  and based on his procedure he was asked to take Rowasa Enemas.  He  returns complaining that the Rowasa Enemas are not helping.  He prefers  the Canasa suppositories.  He also had his B12 level checked and he is  B12 deficient.  He is having difficulty finding his opium and wants to  know if he can try Codeine.  He complains of pain at the base of his  rectum.  His problem is that if he eats the wrong foods it makes him  have lots of bowel movements.  He also complains that his bottom his  raw.   MEDICATIONS:  1. Terazosin.  2. Levsin 4 times a day, 0.125 mg tablets.  3. B12 injections.  4. Mesalamine enemas.   OBJECTIVE:  Weight 176 pounds (up 4 pounds since October of 2008 and  unchanged since February of 2008).  Height 5 feet 7 inches.  Temperature  97.9.  Blood pressure 110/64.  Pulse 88.  GENERAL:  He is in no apparent  distress.  Alert and oriented  x4.LUNGS:  Clear to auscultation  bilaterally.  CARDIOVASCULAR:  A regular rhythm. No murmur.  Normal S1  and S2. ABDOMEN:  Bowel sounds are present.  Soft, nontender, and  nondistended.  No rebound or guarding. RECTAL:  His ileoanal anastomosis  is narrow.  It is approximately 1-cm opening, the width of my finger.  The exam is tender.  No palpable masses.   ASSESSMENT:  Mark Morrison is a 65 year old male with an ileal pouch with  anal anastomosis.  He continues to have loose stool, especially with  cantaloupe, ice cream, and sometimes milkshakes.  He does have evidence  of pouchitis and mild inflammation in his terminal ileum which does not  appear to be consistent with inflammatory bowel disease.  His initial  diagnosis was ulcerative colitis.  The differential diagnosis for his  continued problems include the pouchitis, and/or bacterial overgrowth.  He may also have lactose intolerance.  Thank you for allowing me to see Mark Morrison in consultation.  My  recommendations follow.   RECOMMENDATIONS:  1. Would continue to receive B12 injections from Dr. Renne Morrison.  2. He is asked to add a probiotic daily.  3. We will also place him on Augmentin 500 mg twice a day for 7 days.      If his symptoms following the 7-day regimen of Augmentin, he is to      begin Pentasa 1 g q.i.d.  He is given samples and a prescription.  4. He may try Codeine 30-mg tablets 1/2 to 1 p.o. every 6 hours as      needed for loose stool.  5. I am not certain that his discomfort or his loose stools are      secondary to narrowing of his anastomosis but we will consider      surgical referral if his symptoms persist.  6. Followup appointment in 3 weeks with Mark Morrison.  If the      antibiotics made his symptoms better, then would favor a course of      antibiotics monthly to prevent bacterial overgrowth.       Mark Morrison, M.D.  Electronically Morrison     SM/MEDQ  D:  12/15/2006  T:  12/16/2006  Job:  [id number]    cc:   Mark Morrison. Mark Morrison, M.D.

## 2010-07-08 NOTE — Assessment & Plan Note (Signed)
NAMEMarland Kitchen  Mark Morrison, Mark Morrison                 CHART#:  [id number]   DATE:  04/19/2007                       DOB:  07-Mar-1945   CHIEF COMPLAINT:  Follow up of chronic loose stools.   SUBJECTIVE:  The patient is here for followup.  He was last seen on  03/09/2007.  He has a history of B12 deficiency, pouchitis, ileal pouch  with anal anastomosis in 2004.  He says he has been doing fairly well.  He is having 6 or 7 loose stools a day.  Most of the stools occur  between the time of 5 p.m. and 11 p.m. at night.  He denies any frequent  nocturnal stools at this point.  He occasionally has one around 3 a.m.  His stool frequency is worse related to certain foods.  He denies any  abdominal pain or weight loss.  He does have response to antibiotics  when he takes them for pouchitis.  He has also had some response on  Pentasa before but it has been very difficult for him to continue due to  expense.  Currently, he is not on Pentasa.   CURRENT MEDICATIONS:  See updated list.   ALLERGIES:  NO KNOWN DRUG ALLERGIES.   PHYSICAL EXAM:  Weight 182, up 4 pounds.  Temp 97.9.  Blood pressure  118/80.  Pulse 88.  GENERAL:  Pleasant, well-nourished, well-developed, Caucasian male in no  acute distress.  SKIN:  Warm and dry.  No jaundice.  ABDOMEN:  Positive bowel sounds.  Soft, nontender, and nondistended.  No  organomegaly or masses.   IMPRESSION:  The patient is a 65 year old gentleman with chronic loose  stools with history of chronic pouchitis.  His symptoms do respond to  antibiotics both Augmentin and Flagyl previously.  In addition, he has  had ileitis on prior biopsies.   PLAN:  1. We will resume Pentasa 1 g 4 times a day.  Samples provided for      about 1 week's worth although we will have him fill out the patient      assistance forms.  If his diarrhea worsens he is      to let us know and we will cycle another course of Flagyl.  2. Office visit in 3 months.       Tana Coast, P.A.  Electronically Signed     Kassie Mends, M.D.  Electronically Signed    LL/MEDQ  D:  04/19/2007  T:  04/20/2007  Job:  [id number]   cc:   Soyla Murphy. [name], M.D.

## 2010-07-08 NOTE — Assessment & Plan Note (Signed)
NAMEMarland Kitchen  Morrison, Mark                 CHART#:  [id number]   DATE:  01/05/2007                       DOB:  December 10, 1945   CHIEF COMPLAINT:  Follow up diarrhea.   SUBJECTIVE:  [name] is here for a follow up visit.  He has a history  of ulcerative colitis status post proctocolectomy with ileal pouch -  anal anastomosis.  He has a history of chronic diarrhea.  He was last  seen by Dr. Cira Servant on December 15, 2006.  He is on B-12 injections.  He  was found to have B-12 deficiency recently.  At his last office visit,  he was asked to begin Probiotics daily.  He was also given a seven day  course of Augmentin to treat possible bacterial overgrowth.  He was also  given Pentasa to begin if his symptoms did not improve.  He tells me  that he feels much better.  He is down to having a couple of bowel  movements in the morning.  He has 2-3 after around 5 p.m. in the  evening.  He continues to have some nocturnal diarrhea, but only once or  twice, and states it is a lot better than it had been.  He noted some  improvement when he took that Augmentin.  However, he did also begin the  Pentasa.  He has been on it for a couple of weeks.  He also states he is  on a medication that he keeps in his refrigerator, but he cannot recall  the name.  He has taken it once daily.  He is supposed to call us with  the name.  Overall, he feels much better.  He states his stools are much  more formed.  He is still uses some codeine which he asked for because  the opium is so hard to get filled.  He denies any blood in the stool.  His appetite is great.  He denies any abdominal pain.   CURRENT MEDICATIONS:  See updated list.   ALLERGIES:  No known drug allergies.   PHYSICAL EXAMINATION:  VITAL SIGNS:  Weight 176 which is stable,  temperature 98.1, blood pressure 116/80, pulse 72.  GENERAL:  Pleasant, well-nourished, well-developed Caucasian male in no  acute distress.  SKIN:  Warm and dry, no jaundice.  ABDOMEN:   Positive bowel sounds.  Soft, nontender, nondistended, no  organomegaly or masses.  No rebound tenderness or guarding.   IMPRESSION:  Mark Morrison is a pleasant 65 year old gentleman with an ileal  pouch - anal anastomosis with initial diagnosis of ulcerative colitis in  the past.  He seems to be doing better, but he took antibiotics for  possible bacterial overgrowth and is also on Pentasa given the findings  on his most recent ileoscopy.  We may be dealing both with chronic  pouchitis and ileitis.   PLAN:  Would like him to continue oral mesalamine therapy in the way of  Pentasa at this time.  He needs to also continue Probiotic, which I am  hoping that is what he is on, but he will have to call me with a full  medication list.  As long as he continued to improve, we will continue  this course of care.  However, patient was advised that if the diarrhea  seems to worsen,  he should  give me a call and we will give him another course of Augmentin for  bacterial overgrowth.  Office visit with Dr. Anselmo Rod in six weeks.       Tana Coast, P.A.  Electronically Signed     R. Roetta Sessions, M.D.  Electronically Signed    LL/MEDQ  D:  01/05/2007  T:  01/06/2007  Job:  [id number]

## 2010-07-08 NOTE — Op Note (Signed)
NAME:  Mark Morrison, Mark Morrison                ACCOUNT NO.:  [id number]   MEDICAL RECORD NO.:  [id number]          PATIENT TYPE:  AMB   LOCATION:  DAY                           FACILITY:  [facility]   PHYSICIAN:  Kassie Mends, M.D.      DATE OF BIRTH:  [birth year]   DATE OF PROCEDURE:  11/22/2006  DATE OF DISCHARGE:                               OPERATIVE REPORT   PROCEDURE:  Ileooscopy with cold forceps biopsies.   INDICATION FOR EXAM:  Mark Morrison is a 65 year old male who had a  proctocolectomy with ileal pouch and ileoanal anastomosis secondary to  ulcerative colitis.  He has been seen by several gastroenterologists for  continued diarrhea.  He has been given Flagyl, amoxicillin, and tincture  of opium, as well as Canasa suppositories to help to relieve his  symptoms.  His symptoms persist.   FINDINGS:  1. A few scattered ulcers seen in the neoterminal ileum.  Biopsies      obtained via cold forceps.  2. Ulcerated erythematous lesions seen at the ileoanal anastomosis.      Biopsies obtained via cold forceps.  3. Shallow ulcers seen in the anal pouch.  Biopsies obtained with via      cold forceps.  4. Mark Morrison appears to have narrowing of his anus on rectal exam.   RECOMMENDATIONS:  1. Will call Mr. Lightner with the results of his biopsies.  2. No aspirin, NSAIDs, or anticoagulation until the biopsy results are      known.  3. He may resume his previous diet.  4. Will begin Rowasa enemas because Mr. Likins likely has pouchitis.      The differential diagnosis includes a low likelihood of      inflammatory bowel disease.  5. Follow up with Lorenza Burton in 2 weeks.   MEDICATIONS:  1. Demerol 25 mg IV.  2. Versed 3 mg IV.   PROCEDURE TECHNIQUE:  Physical exam was performed and informed consent  was obtained from the patient explaining the benefits, risks, and  alternatives to the procedure.  The patient was connected to monitor in  the left lateral position.  Continuous oxygen was provided by  nasal  cannula and IV medicine administered through an indwelling cannula.  After administration of sedation and rectal exam, the scope was advanced  approximately 40 cm  from the anal verge.  The scope was removed slowly by carefully  examining the color, texture, anatomy, and integrity of the mucosa on  the way out.  The patient was recovered in endoscopy and discharged home  in satisfactory condition.      Kassie Mends, M.D.  Electronically Signed     SM/MEDQ  D:  11/22/2006  T:  11/22/2006  Job:  [id number]   cc:   Soyla Murphy. [name], M.D.  Fax: [phone]

## 2010-07-08 NOTE — Assessment & Plan Note (Signed)
NAME:  Mark Morrison, Mark Morrison                 CHART#:  [id number]   DATE:  07/20/2007                       DOB:  [birth year]   REFERRING PHYSICIAN:  Soyla Murphy. [name], M.D.   PROBLEM LIST:  1. Chronic loose stools following proctocolectomy in 2004, with      evidence of pouchitis and ileitis on ileoscopy in September 2008.  2. B12 deficiency.  3. Benign prostate hypertrophy.  4. History of pulmonary embolism and deep vein thrombosis.   SUBJECTIVE:  Mark Morrison is a 65 year old male, who presents as a return  patient visit.  He presented approximately two weeks ago, complaining of  increasing diarrhea.  His chart has been reviewed up to October of 2008.  He has had the following medications for his complaint of intermittent  worsening of loose stools: Pentasa (too expensive).  Rowasa enemas (no  benefit), Canasa suppositories (no benefit), Augmentin (symptoms  improved) and Flagyl (symptoms improved).  He also has required the use  of tincture of opium or codeine in the past for loose stool.  He also  has used hyoscyamine.   Today, he presents and he feels very well after taking Augmentin for  seven days.  He says he can go four to five months without having any  bowel problems.  If he is not careful about what he eats, then he will  have a day where he has four to five bowel movements during the daytime  and two at night.  If he eats right, things are fine.  Sometimes, if  he feels like he is stopped up, he will eat ice cream or chocolate milk.  He denies any abdominal pain.   MEDICATIONS:  1. B12 every 3 months.  2. Aspirin 81 mg daily.  3. Multivitamin.  4. Fish oil.  5. Prostate medication.   OBJECTIVE:   PHYSICAL EXAM:  Weight 180 pounds (unchanged since February 2009),  height 5 feet 7 inches, temperature 98, blood pressure 118/74, pulse  92.GENERAL:  He is in no apparent distress, alert and oriented times  four.LUNGS:  Clear to auscultation bilaterally.  CARDIOVASCULAR EXAM:   Regular rhythm, no murmurs.ABDOMEN:  Bowel sounds  are present, soft, nontender, nondistended.  Abdominal incision is well-  healed.   ASSESSMENT:  Mark Morrison is a 65 year old male, who had ulcerative  colitis, which resulted in subsequent proctocolectomy with ileal  anastomosis and anal pouch creation.  He is doing fairly well today.  Thank you for allowing me to see Mark Morrison in consultation.  My  recommendations follow.   RECOMMENDATIONS:  1. Mark Morrison has responded to Augmentin.  His intermittent loose      stools may be small bowel bacterial overgrowth or a flare of his      pouchitis.  He is given a prescription for Augmentin 500 mg every      12 hours for five days and cautioned not to use it more than once a      month.  He was given two refills.  An alternative medication to      consider would be Flagyl, if he fails to respond to the Augmentin.  2. Return patient visit in six months.       Kassie Mends, M.D.  Electronically Signed     SM/MEDQ  D:  07/20/2007  T:  07/20/2007  Job:  [id number]   cc:   Soyla Murphy. [name], M.D.

## 2010-07-11 NOTE — H&P (Signed)
[place]. [facility]  Patient:    Mark Morrison, Mark Morrison Visit Number: [id number] MRN: [id number]          Service Type: Attending:  Verlin Grills, M.D. Dictated by:   Verlin Grills, M.D. Adm. Date:  02/06/01   CC:         Mark Morrison, M.D.   History and Physical  ADMISSION DIAGNOSIS:  Universal ulcerative colitis.  HISTORY OF PRESENT ILLNESS:  Mark Morrison (date of birth 16-May-1945), is a 65 year old male followed by Mark Morrison, M.D.  Mark Morrison tells me he underwent a colonoscopy approximately nine years ago and was diagnosed with ulcerative colitis.  Mark Morrison presents to the [facility] Emergency Room with a six-week history of bloody stools, up to 15 stools daily.  He was initially prescribed Asacol which he stopped after three days when his symptoms did not improve. He has also been on Cipro, Lomotil, and an antiparasitic drug for tape worm.  Mark Morrison abdominal pain has resolved as well as his vomiting.  He has not required prednisone in the past for his colitis.  ALLERGIES:  No known drug allergies.  CHRONIC MEDICATIONS: 1. Visoporolol. 2. Hydrochlorothiazide 10/625 mg q.d. for hypertension.  PAST MEDICAL HISTORY: 1. Ulcerative colitis. 2. Hypertension.  HABITS:  Mark Morrison does not smoke cigarettes or consume alcohol.  PHYSICAL EXAMINATION:  VITAL SIGNS:  Blood pressure 127/82, temperature 98 degrees.  GENERAL:  Mark Morrison appears alert and lying comfortably on his stretcher.  HEENT:  Sclerae nonicteric.  Conjunctivae normal.  Oropharynx normal.  LUNGS:  Clear to auscultation.  HEART:  Regular rate and rhythm without murmurs.  ABDOMEN:  Soft, flat, and nontender.  No hepatosplenomegaly or ascites.  EXTREMITIES:  No edema.  SKIN:  Warm and dry.  X-RAYS:  CT scan of abdomen and pelvis revealed universal colitis without free air.  LABORATORY:  Hemoglobin 13.8 grams, white blood cell count  25,200.  Complete metabolic profile was normal except for a serum potassium 2.9 and albumin 2.8. Prothrombin time normal.  Urinalysis normal, except for specific gravity 1.026.  ASSESSMENT/PLAN:  Universal ulcerative colitis by abdominal CAT scan, rule out C. difficile toxin induced colitis, infection. Dictated by:   Verlin Grills, M.D. Attending:  Verlin Grills, M.D. DD:  02/06/01 TD:  02/06/01 Job: [id number]

## 2010-07-11 NOTE — Op Note (Signed)
NAME:  Mark Morrison, Mark Morrison                          ACCOUNT NO.:  [id number]   MEDICAL RECORD NO.:  [id number]                   PATIENT TYPE:  AMB   LOCATION:  ENDO                                 FACILITY:  [facility]   PHYSICIAN:  Danise Edge, M.D.                DATE OF BIRTH:  1945-02-27   DATE OF PROCEDURE:  06/09/2002  DATE OF DISCHARGE:                                 OPERATIVE REPORT   PROCEDURE:  Colonoscopy and biopsy.   HISTORY:  The patient is a 65 year old male born 2045-04-07.  The patient  underwent proctocolonoscopy with distal ileoscopy 04/30/95, which revealed  ulcerative proctocolitis involving the rectum, sigmoid colon, and descending  colon; the transverse colon, right colon, and distal ileum were normal.   On 02/06/01 the patient was hospitalized at [facility] with a flare  of ulcerative colitis associated with a positive Clostridium difficile toxin  screen.   On 12/01/01 he underwent a CT scan of the abdomen and pelvis, which revealed  an enlarged prostate and proctocolitis involving the rectum, sigmoid colon,  descending colon, and distal transverse colon.   The patient develops bloody diarrhea when he tries to taper his prednisone.  He continues on Asacol 2000 mg b.i.d.  A recent C. difficile toxin screen of  his stool was negative.   ENDOSCOPIST:  Danise Edge, M.D.   PREMEDICATION:  Versed 5 mg, Demerol 50 mg.   DESCRIPTION OF PROCEDURE:  After obtaining informed consent, the patient was  placed in the left lateral decubitus position.  I administered intravenous  Demerol and intravenous Versed to achieve conscious sedation for the  procedure.  The patient's blood pressure, oxygen saturation, and cardiac  rhythm were monitored throughout the procedure and documented in the medical  record.   Anal inspection was normal.  Digital rectal exam revealed an enlarged  prostate.  The Olympus adult colonoscope was introduced into the rectum and  advanced  to the cecum.  Colonic preparation for the exam today was  satisfactory.   Rectum, sigmoid colon, and descending colon:  There is marked inflammation  with ulceration involving the entire length of his rectum and left colon to  the level of the splenic flexure.  Multiple biopsies were taken.   Splenic flexure, transverse colon, hepatic flexure, ascending colon, cecum,  and ileocecal valve:  The entire length of colon from the splenic flexure to  the cecum is filled with sessile polyps of varying sizes.  Most of these are  probably inflammatory polyps related to his inflammatory bowel disease, and  I cannot rule out neoplastic polyps.  There are too numerous polyps to  remove endoscopically.  Multiple biopsies were taken of these polyps along  the length of his colon.    ASSESSMENT:  Universal ulcerative colitis; the right colon, transverse  colon, and splenic flexure are filled with colon polyps.  The left colon and  rectum reveal  severely inflamed mucosa.   RECOMMENDATIONS:  I would consider the patient a good candidate for  proctocolectomy.                                               Danise Edge, M.D.    MJ/MEDQ  D:  06/09/2002  T:  06/10/2002  Job:  [id number]

## 2010-07-11 NOTE — Discharge Summary (Signed)
[place]. [facility]  Patient:    Mark Morrison, Mark Morrison Visit Number: [id number] MRN: [id number]          Service Type: Attending:  Verlin Grills, M.D. Dictated by:   Verlin Grills, M.D.                             Discharge Summary  DISCHARGE DIAGNOSES: 1. Chronic ulcerative colitis. 2. Positive Clostridium difficile toxin associated colitis. 3. Hypertension.  DISCHARGE MEDICATIONS: 1. Disoprofol 10/6.25 mg daily. 2. Prednisone 40 mg each morning for two weeks and then taper daily dose    of prednisone by 5 mg each week until prednisone is discontinued. 3. Flagyl 500 mg t.i.d. with meals for ten days.  OFFICE FOLLOW-UP:  I will see the patient in my office in two weeks.  HOSPITAL COURSE:  The patient is a 65 year old male with a nine-year history of ulcerative colitis.  The patient presented to the [facility] Emergency Room with a six-week history of bloody diarrhea.  Over the past six weeks, he has been treated with Cipro, Lomotil, and an antiparasitic drug for a possible tapeworm.  The patients admission abdominal CT scan was consistent with universal colitis.  His C. difficile toxin screen of his stool was positive.  The patient was admitted on intravenous Solu-Medrol and not on therapy for C. difficile toxin.  Within 48 hours of intravenous Solu-Medrol, he was markedly improved.  The morning of his discharge his C. difficile toxin was positive. I am not sure if he actually has C. difficile-induced colitis because his symptoms responded to treatment for pan-ulcerative colitis.  The patient is tolerating a regular diet and having only two diarrheal stools per day.  He reports no abdominal pain and feels well.  He is discharged in stable medical condition to be followed up by me in the office.  DIAGNOSTIC STUDIES:  Stool C. difficile toxin positive.  Urinalysis normal. Complete metabolic profile on admission normal except for a serum  potassium 2.9 and serum albumin 2.8.  Coagulation profile normal.  Admission white blood cell count 25,200, hemoglobin 13.8 g, and platelet count normal.  Stool culture pending at discharge. Dictated by:   Verlin Grills, M.D. Attending:  Verlin Grills, M.D. DD:  02/08/01 TD:  02/08/01 Job: [phone]

## 2010-07-11 NOTE — Consult Note (Signed)
NAME:  Mark Morrison, Mark Morrison                ACCOUNT NO.:  [id number]   MEDICAL RECORD NO.:  [id number]         PATIENT TYPE:  AMB   LOCATION:                                FACILITY:  [facility]   PHYSICIAN:  Kassie Mends, M.D.      DATE OF BIRTH:  1945/06/20   DATE OF CONSULTATION:  03/10/2006  DATE OF DISCHARGE:                                 CONSULTATION   PROBLEM LIST:  1. Proctocolectomy, with ileal pouch and ileoanal anastomosis.  2. Chronic diarrhea, with recurrent acute flares of diarrhea.  3. History of chronic ulcerative proctocolitis, with biopsy-proven      inflammatory and neoplastic polyps.  4. Benign prostate hypertrophy.   SUBJECTIVE:  Mark Morrison is a 65 year old male who was diagnosed with  ulcerative proctocolitis since 1997.  He underwent a total  proctocolectomy with ileal pouch and ileoanal anastomosis in 2004 at  [facility].  The procedure was performed by Dr. Rae Halsted.  His  office number is [phone].  His postoperative course has been  complicated by multiple bouts of diarrhea.  He reports he was told that  he would always have this problem, and he was asked to modify his diet  to alleviate this problem.  He had been followed by Dr. Danise Edge,  and I have notes from September 2005 which show that he was last placed  on opium and loperamide.  He initially came to me as a self-referral  because he began to have up to 10 bowel movements a day and 2-5 times at  nighttime.  No notes were available to me at his initial visit in  December 2007.  He was placed on amoxicillin and Flagyl and tried on  Levbid q.12 h.   His symptoms improved with Levbid and Flagyl and amoxicillin.  He did  not like the fact that the Levbid actually stopped him up.  He  discontinued the use of it.  His Giardia and Cryptosporidium antigen  were negative. He requested Canasa suppositories.  On February 24, 2006,  his request for Canasa suppositories was filled by my P.A.   Today, he is  complaining of having two bowel movements, which are  usually loose.  He is having 4-5 bowel movements a day.  A couple of  nights, he slept all night.  He denies any blood in his stool.  He feels  that the antibiotics and the suppositories have helped him.  He is using  his opium twice a day.  He is still using Levsin underneath his tongue  twice a day.  He actually was able to quit using the opium for 4-5 days  in a row.   MEDICATIONS:  1. Terazosin 2 mg at bedtime.  2. Levsin sublingual 0.125 mg q.i.d.  3. Opium 1 mg q.i.d.  4. Canasa suppositories 1 g at bedtime.   OBJECTIVE:  VITAL SIGNS:  Weight 170 pounds (up 16 pounds since 2005),  height 5 feet 7 inches, BMI 26.6 (slightly overweight), temperature  98.1, blood pressure 110/68, pulse 80.  GENERAL:  He is in no apparent distress.  Alert and oriented x4.  HEENT:  Atraumatic, normocephalic.  Pupils equal and reactive to light.  Mouth:  No oral lesions.  Posterior pharynx without erythema or exudate.  LUNGS:  Clear to auscultation bilaterally.  CARDIOVASCULAR:  Regular rhythm, no murmur.  Normal S1 and S2.  ABDOMEN:  Bowel sounds are present.  Soft, nontender, nondistended.  No  rebound or guarding.   ASSESSMENT:  Mark Morrison is a 65 year old male with history of ulcerative  colitis diagnosed in 1997.  He had a total proctocolectomy with ileal  pouch and ileoanal anastomosis, and continues to have loose stool, but  his symptoms are improved after antibiotics and Canasa suppositories.  It is possible that he had bacterial overgrowth and pouchitis.   Thank you for allowing me to see Mark Morrison in consultation.  My  recommendations follow.   RECOMMENDATIONS:  1. I gave him prescriptions for his tincture of opium 1 mL p.o.      q.i.d., with no refills, for March 10, 2006, April 10, 2006,      and May 09, 2006.  2. He has a return patient visit in 6 weeks.  3. He is given a prescription for amoxicillin 500 mg 1 p.o. q.8  h.,      and for Flagyl 500 mg 1 p.o. q.8 h. for 7 days.  He may repeat a      course of antibiotics once a month for 3 months.  4. He is asked to call if he feels he needs the Canasa suppositories.  5. I will also perform a flexible sigmoidoscopy as soon as possible      after tap water enemas in the endoscopy suite.      Kassie Mends, M.D.  Electronically Signed     SM/MEDQ  D:  03/11/2006  T:  03/11/2006  Job:  [id number]   cc:   Soyla Murphy. [name], M.D.  Fax: [phone]

## 2010-07-13 ENCOUNTER — Emergency Department (HOSPITAL_COMMUNITY)
Admission: EM | Admit: 2010-07-13 | Discharge: 2010-07-13 | Disposition: A | Discharge status: Home / Self Care | Payer: Medicare HMO | Attending: Emergency Medicine | Admitting: Emergency Medicine

## 2010-07-13 DIAGNOSIS — R339 Retention of urine, unspecified: Secondary | ICD-10-CM | POA: Insufficient documentation

## 2010-07-13 LAB — CBC
MCH: 34.9 pg — ABNORMAL HIGH (ref 26.0–34.0)
Platelets: 100 10*3/uL — ABNORMAL LOW (ref 150–400)
RBC: 3.12 MIL/uL — ABNORMAL LOW (ref 4.22–5.81)
WBC: 3.2 10*3/uL — ABNORMAL LOW (ref 4.0–10.5)

## 2010-07-13 LAB — URINE MICROSCOPIC-ADD ON

## 2010-07-13 LAB — BASIC METABOLIC PANEL
Calcium: 9.7 mg/dL (ref 8.4–10.5)
GFR calc Af Amer: >60 mL/min (ref >60)
GFR calc non Af Amer: >60 mL/min (ref >60)
Sodium: 134 mEq/L — ABNORMAL LOW (ref 135–145)

## 2010-07-13 LAB — URINALYSIS, ROUTINE W REFLEX MICROSCOPIC
Glucose, UA: NEGATIVE mg/dL
Specific Gravity, Urine: 1.01 (ref 1.005–1.030)

## 2010-07-13 LAB — DIFFERENTIAL
Eosinophils Absolute: 0 10*3/uL (ref 0.0–0.7)
Monocytes Absolute: 0.8 10*3/uL (ref 0.1–1.0)
Neutrophils Relative %: 65 % (ref 43–77)

## 2010-07-14 LAB — URINE CULTURE: Colony Count: NO GROWTH

## 2010-12-01 LAB — CBC
HCT: 37.2 — ABNORMAL LOW
MCHC: 35.3
MCV: 105.1 — ABNORMAL HIGH
RBC: 3.54 — ABNORMAL LOW

## 2010-12-01 LAB — DIFFERENTIAL
Eosinophils Absolute: 0.1 — ABNORMAL LOW
Eosinophils Relative: 2
Lymphs Abs: 1.1
Monocytes Absolute: 0.7
Monocytes Relative: 24 — ABNORMAL HIGH

## 2010-12-01 LAB — FOLATE: Folate: >20

## 2010-12-01 LAB — IRON AND TIBC: UIBC: 240

## 2010-12-01 LAB — COPPER, SERUM: Copper: 85 ug/dL (ref 70–155)

## 2010-12-01 LAB — FERRITIN: Ferritin: 51 (ref 22–322)

## 2010-12-01 LAB — RETICULOCYTES: Retic Ct Pct: 3.5 — ABNORMAL HIGH

## 2010-12-01 LAB — LACTATE DEHYDROGENASE: LDH: 144

## 2010-12-02 LAB — METHYLMALONIC ACID, SERUM: Methylmalonic Acid, Quantitative: 93 nmol/L (ref 87–318)

## 2010-12-02 LAB — DIFFERENTIAL
Basophils Absolute: 0
Eosinophils Absolute: 0 — ABNORMAL LOW
Eosinophils Relative: 1
Monocytes Absolute: 0.5

## 2010-12-02 LAB — CBC
HCT: 37.6 — ABNORMAL LOW
Hemoglobin: 13.2
MCHC: 35.2
MCV: 106.2 — ABNORMAL HIGH
Platelets: 118 — ABNORMAL LOW
RDW: 15.6 — ABNORMAL HIGH

## 2010-12-24 ENCOUNTER — Encounter: Payer: Self-pay | Admitting: Internal Medicine

## 2010-12-25 ENCOUNTER — Ambulatory Visit: Payer: Medicare HMO | Admitting: Internal Medicine

## 2011-01-17 ENCOUNTER — Emergency Department (HOSPITAL_COMMUNITY)
Admission: EM | Admit: 2011-01-17 | Discharge: 2011-01-17 | Disposition: A | Discharge status: Home / Self Care | Payer: Medicare HMO | Attending: Emergency Medicine | Admitting: Emergency Medicine

## 2011-01-17 ENCOUNTER — Encounter (HOSPITAL_COMMUNITY): Payer: Self-pay | Admitting: *Deleted

## 2011-01-17 DIAGNOSIS — K2270 Barrett's esophagus without dysplasia: Secondary | ICD-10-CM | POA: Insufficient documentation

## 2011-01-17 DIAGNOSIS — K5190 Ulcerative colitis, unspecified, without complications: Secondary | ICD-10-CM | POA: Insufficient documentation

## 2011-01-17 DIAGNOSIS — R339 Retention of urine, unspecified: Secondary | ICD-10-CM | POA: Insufficient documentation

## 2011-01-17 DIAGNOSIS — Y849 Medical procedure, unspecified as the cause of abnormal reaction of the patient, or of later complication, without mention of misadventure at the time of the procedure: Secondary | ICD-10-CM | POA: Insufficient documentation

## 2011-01-17 DIAGNOSIS — T8389XA Other specified complication of genitourinary prosthetic devices, implants and grafts, initial encounter: Secondary | ICD-10-CM | POA: Insufficient documentation

## 2011-01-17 DIAGNOSIS — E538 Deficiency of other specified B group vitamins: Secondary | ICD-10-CM | POA: Insufficient documentation

## 2011-01-17 DIAGNOSIS — Z86718 Personal history of other venous thrombosis and embolism: Secondary | ICD-10-CM | POA: Insufficient documentation

## 2011-01-17 DIAGNOSIS — Z7982 Long term (current) use of aspirin: Secondary | ICD-10-CM | POA: Insufficient documentation

## 2011-01-17 DIAGNOSIS — N390 Urinary tract infection, site not specified: Secondary | ICD-10-CM | POA: Insufficient documentation

## 2011-01-17 LAB — URINALYSIS, ROUTINE W REFLEX MICROSCOPIC
Protein, ur: 100 mg/dL — AB
Urobilinogen, UA: 1 mg/dL (ref 0.0–1.0)

## 2011-01-17 LAB — URINE MICROSCOPIC-ADD ON

## 2011-01-17 MED ORDER — CIPROFLOXACIN HCL 250 MG PO TABS
500.0000 mg | ORAL_TABLET | Freq: Two times a day (BID) | ORAL | Status: DC
  Administered 2011-01-17: 500 mg via ORAL
  Filled 2011-01-17: qty 2

## 2011-01-17 MED ORDER — CIPROFLOXACIN HCL 500 MG PO TABS: 250.0000 mg | ORAL_TABLET | Freq: Two times a day (BID) | ORAL | Status: AC

## 2011-01-17 NOTE — ED Notes (Signed)
Pt states he has a foley cath in place and it has been leaking past the tube out his penis.

## 2011-01-17 NOTE — ED Provider Notes (Signed)
History     CSN: [id number] Arrival date & time: 01/17/2011  6:16 PM   First MD Initiated Contact with Patient 01/17/11 1826      Chief Complaint  Patient presents with  . urinating past cath     (Consider location/radiation/quality/duration/timing/severity/associated sxs/prior treatment) HPI Comments: Mark Morrison is a 65 y.o. male who presents to the Emergency Department complaining of leaking around a foley catheter that he has had in place for 9 days for urinary retention. The leaking began last night and has gotten worse over the course of the day today. Nothing he does has made the leaking better. Bearing down to urinate makes the leaking worse.He denies fever, chills, back pain. His follow up appointment with urologist, Dr. Rito Ehrlich is on Tuesday, 01/20/11. He is requesting the catheter be removed.   Past Medical History  Diagnosis Date  . Ulcerative colitis 2004    with IPPA  . 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 History of DVT (deep vein thrombosis)     and PE  . Barrett's esophagus 1/012    on EGD  . Vitamin B 12 deficiency     Past Surgical History  Procedure Date  . Knee surgery     Left  . Abdominoperineal proctocolectomy 2004  . Colonoscopy 11/22/2006    ULCERATIVE COLITIS    History reviewed. No pertinent family history.  History  Substance Use Topics  . Smoking status: Current Everyday Smoker -- 1.0 packs/day  . Smokeless tobacco: Not on file  . Alcohol Use: No      Review of Systems A 10 review of systems reviewed and are negative for acute change except as noted in the HPI. Allergies  Review of patient's allergies indicates no known allergies.  Home Medications   Current Outpatient Rx  Name Route Sig Dispense Refill  . ASPIRIN 81 MG PO TABS Oral Take 81 mg by mouth daily.      . CYANOCOBALAMIN 1000 MCG/ML IJ SOLN Intramuscular Inject 1,000 mcg into the muscle once.      . OMEGA-3 FATTY ACIDS 1000 MG PO CAPS Oral Take 2 g by  mouth daily.      Marland Kitchen HYOSCYAMINE SULFATE 0.125 MG PO TABS Oral Take 0.125 mg by mouth every 4 (four) hours as needed.      Marland Kitchen METRONIDAZOLE 500 MG PO TABS Oral Take 500 mg by mouth 3 (three) times daily.      . MULTIVITAMINS PO CAPS Oral Take 1 capsule by mouth daily.      Marland Kitchen TAMSULOSIN HCL 0.4 MG PO CAPS Oral Take by mouth.        BP 118/84  Pulse 104  Temp(Src) 97.3 F (36.3 C) (Oral)  Resp 20  Ht 5\' 7"  (1.702 m)  Wt 167 lb (75.751 kg)  BMI 26.16 kg/m2  SpO2 99%  Physical Exam  Nursing note and vitals reviewed. Constitutional: He is oriented to person, place, and time. He appears well-developed and well-nourished.  HENT:  Head: Normocephalic and atraumatic.  Eyes: EOM are normal.  Neck: Normal range of motion.  Cardiovascular: Normal rate, normal heart sounds and intact distal pulses.   Pulmonary/Chest: Breath sounds normal.  Abdominal: Soft. He exhibits no distension. There is no tenderness.  Genitourinary: Penis normal.       Indwelling cath  Musculoskeletal: Normal range of motion.  Neurological: He is alert and oriented to person, place, and time.  Skin: Skin is warm and dry.    ED  Course  Procedures (including critical care time)  Results for orders placed during the hospital encounter of 01/17/11  URINALYSIS, ROUTINE W REFLEX MICROSCOPIC      Component Value Range   Color, Urine RED (*) YELLOW    Appearance HAZY (*) CLEAR    Specific Gravity, Urine 1.020  1.005 - 1.030    pH 7.0  5.0 - 8.0    Glucose, UA NEGATIVE  NEGATIVE (mg/dL)   Hgb urine dipstick LARGE (*) NEGATIVE    Bilirubin Urine NEGATIVE  NEGATIVE    Ketones, ur TRACE (*) NEGATIVE (mg/dL)   Protein, ur 409 (*) NEGATIVE (mg/dL)   Urobilinogen, UA 1.0  0.0 - 1.0 (mg/dL)   Nitrite POSITIVE (*) NEGATIVE    Leukocytes, UA SMALL (*) NEGATIVE   URINE MICROSCOPIC-ADD ON      Component Value Range   WBC, UA 21-50  <3 (WBC/hpf)   RBC / HPF TOO NUMEROUS TO COUNT  <3 (RBC/hpf)   Bacteria, UA MANY (*) RARE           MDM  Patient with indwelling foley catheter x 9 days for urniary retention  developed leaking around the catheter. Catheter was removed at the patient request. UA sent and shows evidence of infection. Antibiotic started. Urine culture pending. Patient to keep follow up appointment with urologist, Dr. Rito Ehrlich, 01/20/11.  MDM Reviewed: nursing note and vitals Interpretation: labs           Nicoletta Dress. Colon Branch, MD 01/17/11 [phone]

## 2011-01-18 ENCOUNTER — Encounter (HOSPITAL_COMMUNITY): Payer: Self-pay

## 2011-01-18 ENCOUNTER — Emergency Department (HOSPITAL_COMMUNITY)
Admission: EM | Admit: 2011-01-18 | Discharge: 2011-01-18 | Disposition: A | Discharge status: Home / Self Care | Payer: Medicare HMO | Attending: Emergency Medicine | Admitting: Emergency Medicine

## 2011-01-18 DIAGNOSIS — R339 Retention of urine, unspecified: Secondary | ICD-10-CM | POA: Insufficient documentation

## 2011-01-18 DIAGNOSIS — Z86718 Personal history of other venous thrombosis and embolism: Secondary | ICD-10-CM | POA: Insufficient documentation

## 2011-01-18 DIAGNOSIS — N400 Enlarged prostate without lower urinary tract symptoms: Secondary | ICD-10-CM | POA: Insufficient documentation

## 2011-01-18 DIAGNOSIS — Z7982 Long term (current) use of aspirin: Secondary | ICD-10-CM | POA: Insufficient documentation

## 2011-01-18 DIAGNOSIS — F17200 Nicotine dependence, unspecified, uncomplicated: Secondary | ICD-10-CM | POA: Insufficient documentation

## 2011-01-18 DIAGNOSIS — E538 Deficiency of other specified B group vitamins: Secondary | ICD-10-CM | POA: Insufficient documentation

## 2011-01-18 DIAGNOSIS — K2270 Barrett's esophagus without dysplasia: Secondary | ICD-10-CM | POA: Insufficient documentation

## 2011-01-18 LAB — URINE MICROSCOPIC-ADD ON

## 2011-01-18 LAB — URINALYSIS, ROUTINE W REFLEX MICROSCOPIC
Bilirubin Urine: NEGATIVE
Nitrite: NEGATIVE
Protein, ur: NEGATIVE mg/dL
Specific Gravity, Urine: 1.01 (ref 1.005–1.030)
Urobilinogen, UA: 0.2 mg/dL (ref 0.0–1.0)

## 2011-01-18 NOTE — ED Notes (Signed)
Received report from previous RN assessment unchanged

## 2011-01-18 NOTE — ED Notes (Signed)
Pt presents with difficulty urinating. Pt states he had a foley cath taken out last night here and since 1400 today has not been able to urinate.

## 2011-01-18 NOTE — ED Notes (Signed)
Pt states he has not been able to pee since 2 pm today. States he was here last night and had his foley removed and was tx for an UTI. States he is to follow up with nephrology Tuesday

## 2011-01-18 NOTE — ED Notes (Signed)
Leg bag education provided Pt and spouse verbalized understand and demonstrated  understanding

## 2011-01-18 NOTE — ED Provider Notes (Addendum)
Scribed for Mark Hutching, MD, the patient was seen in room [facility] . This chart was scribed by Ellie Lunch.     CSN: [id number] Arrival date & time: 01/18/2011  5:46 PM   First MD Initiated Contact with Patient 01/18/11 1754      Chief Complaint  Patient presents with  . Urinary Retention    (Consider location/radiation/quality/duration/timing/severity/associated sxs/prior treatment) The history is provided by the patient. No language interpreter was used.   6:43 PM  RAS Mark Morrison is a 65 y.o. male who presents to the Emergency Department complaining of 1 day of difficulty urinating. Urinary retention began at 14:30 today and lasted until 17:30. Pt Urinary retention is not associated with abdominal pain.  Pt has h/o 3 previous episodes of urinary retention. Pt recently had a foley cath in for 9 days. Pt states he has an enlarged prostate. Pt sees Dr. Rito Ehrlich and has follow up scheduled in 2 days on Tuesday.    Past Medical History  Diagnosis Date  . Ulcerative colitis 2004    with IPPA  . 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 History of DVT (deep vein thrombosis)     and PE  . Barrett's esophagus 1/012    on EGD  . Vitamin B 12 deficiency     Past Surgical History  Procedure Date  . Knee surgery     Left  . Abdominoperineal proctocolectomy 2004  . Colonoscopy 11/22/2006    ULCERATIVE COLITIS    History reviewed. No pertinent family history.  History  Substance Use Topics  . Smoking status: Current Everyday Smoker -- 1.0 packs/day  . Smokeless tobacco: Not on file  . Alcohol Use: No      Review of Systems  10 Systems reviewed and are negative for acute change except as noted in the HPI.  Allergies  Review of patient's allergies indicates no known allergies.  Home Medications   Current Outpatient Rx  Name Route Sig Dispense Refill  . ASPIRIN 81 MG PO TABS Oral Take 81 mg by mouth daily.      Marland Kitchen CIPROFLOXACIN HCL 500 MG PO TABS Oral Take 0.5  tablets (250 mg total) by mouth 2 (two) times daily. 14 tablet 0  . CYANOCOBALAMIN 1000 MCG/ML IJ SOLN Intramuscular Inject 1,000 mcg into the muscle once.      . OMEGA-3 FATTY ACIDS 1000 MG PO CAPS Oral Take 2 g by mouth daily.      Marland Kitchen HYOSCYAMINE SULFATE 0.125 MG PO TABS Oral Take 0.125 mg by mouth every 4 (four) hours as needed.      Marland Kitchen METRONIDAZOLE 500 MG PO TABS Oral Take 500 mg by mouth 3 (three) times daily.      . MULTIVITAMINS PO CAPS Oral Take 1 capsule by mouth daily.      Marland Kitchen TAMSULOSIN HCL 0.4 MG PO CAPS Oral Take by mouth.        BP 132/80  Pulse 114  Temp(Src) 97.8 F (36.6 C) (Oral)  Resp 18  Ht 5\' 7"  (1.702 m)  Wt 170 lb 4.8 oz (77.248 kg)  BMI 26.67 kg/m2  SpO2 98%  Physical Exam  Nursing note and vitals reviewed. Constitutional: He is oriented to person, place, and time. He appears well-developed and well-nourished.  HENT:  Head: Normocephalic and atraumatic.  Eyes: Conjunctivae and EOM are normal. Pupils are equal, round, and reactive to light.  Neck: Normal range of motion. Neck supple.  Cardiovascular: Normal rate and regular rhythm.  Pulmonary/Chest: Effort normal and breath sounds normal.  Abdominal: Soft. Bowel sounds are normal.  Genitourinary:       The pt. Has difficulty urinating.   Musculoskeletal: Normal range of motion.  Neurological: He is alert and oriented to person, place, and time.  Skin: Skin is warm and dry.  Psychiatric: He has a normal mood and affect.    ED Course  Procedures (including critical care time) OTHER DATA REVIEWED: Nursing notes, vital signs, and past medical records reviewed.   DIAGNOSTIC STUDIES: Oxygen Saturation is 98% on room air, normal by my interpretation.      Labs Reviewed  URINALYSIS, ROUTINE W REFLEX MICROSCOPIC   No results found.  6:44 PM EDP at PT bedside. Urinary retention for Pt's age group usually caused by enlarged prostate. Plan to inset folety Recommended follow up with Dr. Rito Ehrlich to  prevent chronic urinary retention.    No diagnosis found.    MDM  Patient in ED last night for similar symptoms. Antibiotics were started yesterday. Will replace Foley catheter. He is to see urologist on Tuesday.          Mark Hutching, MD 01/18/11 1933  Mark Hutching, MD 01/18/11 [phone]

## 2011-01-20 LAB — URINE CULTURE
Colony Count: NO GROWTH
Culture: NO GROWTH

## 2011-01-26 ENCOUNTER — Encounter (HOSPITAL_COMMUNITY): Payer: Self-pay | Admitting: Pharmacy Technician

## 2011-01-29 ENCOUNTER — Encounter (HOSPITAL_COMMUNITY): Payer: Self-pay

## 2011-01-29 ENCOUNTER — Other Ambulatory Visit: Payer: Self-pay

## 2011-01-29 ENCOUNTER — Encounter (HOSPITAL_COMMUNITY): Payer: Self-pay | Admitting: Pharmacy Technician

## 2011-01-29 ENCOUNTER — Encounter (HOSPITAL_COMMUNITY)
Admission: RE | Admit: 2011-01-29 | Discharge: 2011-01-29 | Disposition: A | Discharge status: Home / Self Care | Payer: Medicare HMO | Source: Ambulatory Visit | Attending: Urology | Admitting: Urology

## 2011-01-29 HISTORY — DX: Gastro-esophageal reflux disease without esophagitis: K21.9

## 2011-01-29 HISTORY — DX: Benign prostatic hyperplasia without lower urinary tract symptoms: N40.0

## 2011-01-29 LAB — DIFFERENTIAL
Eosinophils Relative: 1 % (ref 0–5)
Lymphocytes Relative: 25 % (ref 12–46)
Lymphs Abs: 0.7 10*3/uL (ref 0.7–4.0)
Monocytes Absolute: 1.1 10*3/uL — ABNORMAL HIGH (ref 0.1–1.0)
Monocytes Relative: 36 % — ABNORMAL HIGH (ref 3–12)
Neutro Abs: 1.2 10*3/uL — ABNORMAL LOW (ref 1.7–7.7)

## 2011-01-29 LAB — BASIC METABOLIC PANEL
BUN: 11 mg/dL (ref 6–23)
CO2: 28 mEq/L (ref 19–32)
Calcium: 9.7 mg/dL (ref 8.4–10.5)
Chloride: 101 mEq/L (ref 96–112)
Creatinine, Ser: 0.82 mg/dL (ref 0.50–1.35)
Glucose, Bld: 94 mg/dL (ref 70–99)

## 2011-01-29 LAB — SURGICAL PCR SCREEN: Staphylococcus aureus: NEGATIVE

## 2011-01-29 LAB — CBC
HCT: 35.5 % — ABNORMAL LOW (ref 39.0–52.0)
Hemoglobin: 12.5 g/dL — ABNORMAL LOW (ref 13.0–17.0)
MCV: 103.5 fL — ABNORMAL HIGH (ref 78.0–100.0)
RDW: 15.5 % (ref 11.5–15.5)
WBC: 3 10*3/uL — ABNORMAL LOW (ref 4.0–10.5)

## 2011-01-29 NOTE — Patient Instructions (Addendum)
20 Mark Morrison  01/29/2011   Your procedure is scheduled on: 02-03-2011  Report to [facility] at  900  AM.  Call this number if you have problems the morning of surgery: [phone]   Remember:   Do not eat food:After Midnight.  May have clear liquids:until Midnight .  Clear liquids include soda, tea, black coffee, apple or grape juice, broth.  Take these medicines the morning of surgery with A SIP OF WATER: protonix   Do not wear jewelry, make-up or nail polish.  Do not wear lotions, powders, or perfumes. You may wear deodorant.  Do not shave 48 hours prior to surgery.  Do not bring valuables to the hospital.  Contacts, dentures or bridgework may not be worn into surgery.  Leave suitcase in the car. After surgery it may be brought to your room.  For patients admitted to the hospital, checkout time is 11:00 AM the day of discharge.   Patients discharged the day of surgery will not be allowed to drive home.  Name and phone number of your driver: family Special Instructions: CHG Shower Use Special Wash: 1/2 bottle night before surgery and 1/2 bottle morning of surgery.   Please read over the following fact sheets that you were given: Pain Booklet, MRSA Information, Surgical Site Infection Prevention, Anesthesia Post-op Instructions and Care and Recovery After Surgery Transurethral Resection of the Prostate Transurethral Resection of the Prostate (TURP) is often treatment for non-cancerous (benign) prostatic hyperplasia (BPH) or prostate cancer. BPH commonly begins in middle aged men and can cause symptoms at any age thereafter. The complications that stem from these problems, such as recurrent infection and bladder control and emptying problems, are often helped by this procedure. Both conditions usually cause the prostate to increase in size. TURP is a major surgery that removes part of the prostate gland. The goal is to remove enough prostate to allow for unobstructed flow of  urine. TREATMENT This surgery (TURP procedure) is done using an instrument like a narrow telescope to look into your bladder. This is then used to remove enlarged pieces of your prostate, one piece at a time. This removes the blockage and makes it easier for you to urinate. This is called a transurethral resection of the prostate. A lesser procedure is sometimes done. In this procedure, small cuts are made in the prostate. This lessens the prostates pressure on the urethra. This is called a transurethral incision (cut by the surgeon) of the prostate (TUIP). You will probably be comfortable soon after the operation, but it will take 10 to 12 weeks, or longer, for your prostate to heal after you leave the hospital.  LET YOUR CAREGIVER KNOW ABOUT:  Allergies.   Medications taken including herbs, eye drops, over the counter medications, and creams.   Use of steroids (by mouth or creams).   Previous problems with anesthetics or Novocaine.   History of blood clots (thrombophlebitis).   History of bleeding or blood problems.   Previous surgery.   Previous prostate infections.   Other health problems.  RISKS AND COMPLICATIONS  Rare injury to the bowel (intestine).   Rare injury to the bladder or adjacent blood vessels.   Intestinal/bowel obstruction.   Scarring called "stricture" that cause later problems with the flow of urine.   Bleeding and the need for blood transfusion (more common in those with prostate cancer and those who have previously received radiation therapy).   Inability to control your urine (incontinence). This is more common in  those with prostate cancer and those who have received radiation therapy.   Injury to one of the ureters (tubes that drain the kidneys into the bladder) and/or urethra (the tube that drains the bladder).   Injury to the capsule that holds the prostate. This can lead to leakage of fluid and urine into the belly (abdomen).   The operation can  possibly lead to impotence. This is the inability to get an erection. Treatments are available for these types of problems.   Rare over absorption of the fluids used during the operation. This can then cause low blood levels of sodium, brain swelling, strain on the heart, and fluid accumulation in the lungs. This is more common in long procedures.   Infection.   Blood clots in the legs.   As with any major surgery, there is always the rare chance of a complicating stroke, heart attack, or other complications that will be discussed with you by the surgeon and anesthesiologist.  BEFORE THE PROCEDURE   You may be asked to temporarily adjust your diet. If so, your caregiver will give you specific recommendations.   If you are on blood thinners, stop taking them before the operation, or as your caregiver advises.   You should have nothing to eat or drink after midnight prior to your surgery or as suggested by your caregiver. You may have a sip of water to take medications not stopped for the procedure  PROCEDURE  This operation is performed after you have been given a medication to help you sleep (anesthetic), or with a spinal block. The spinal block keeps you awake but numb from the waist down.  No cut or incision is needed. During the operation, your surgeon passes a viewing and cutting instrument (resectoscope) through the penis into the prostate gland. The instrument contains an electric cutting edge. From inside the prostate, this cutter is used to remove part of the prostate.  HOME CARE INSTRUCTIONS  For your own protection, observe the following precautions for 10 days after your operation.  You may go home with a catheter. Take care of it as directed. You will receive instruction on catheter care.   After catheter removal, empty the bladder whenever you feel a definite desire. Do not try to hold the urine for long periods of time.   For 10 days, avoid all lifting, straining, running,  strenuous work, walks longer than a couple blocks, riding in a car for extended periods, and sexual relations.   Take 2 tablespoons of heavy mineral oil or Metamucil night and morning for 3 or 4 days. After that, gradually reduce the dose to one or two teaspoons twice daily. Stop it after the stools have been normal for a week. If you become constipated, do not strain to move your bowels. You may use an enema. Notify your caregiver about problems.   Even after complete healing, you may continue to urinate once or twice during the night.   In addition to your usual medications, you may be given an antibiotic to take for 10-14 days. Notify your caregiver if you have any side effects or problems with the medication.   Avoid alcohol and caffeinated drinks for 2 weeks, as they are irritating to the bladder. Decaffeinated drinks are fine.   Eat a regular diet, avoiding spicy foods for 2 weeks.   You may continue non-strenuous activities. It is always important to keep active after an operation. This lessens the chance of developing blood clots.   You  may see some recurrence of blood in the urine after discharge from the hospital. Even a small amount of blood colors the urine very red. If this occurs, force fluids again as you did in the hospital. This is generally not a concern.  SEEK MEDICAL CARE IF:   You have chills or night sweats.   You are leaking around your catheter or have problems with your catheter.   You develop side effects that you think are coming from your medications.  SEEK IMMEDIATE MEDICAL CARE IF:   You are suddenly unable to urinate. This is an emergency.   You develop shortness of breath or chest pains.   Bleeding persists or clots develop.   You have a fever.   You develop pain in your back or over your lower belly (abdomen).   You develop pain or swelling in your legs.   You develop swelling in your abdomen or have a sudden weight gain.   The problems get  worse rather than better.  Document Released: 02/09/2005 Document Revised: 10/22/2010 Document Reviewed: 08/28/2008 [facility] Patient Information 2012 [place], [place].PATIENT INSTRUCTIONS POST-ANESTHESIA  IMMEDIATELY FOLLOWING SURGERY:  Do not drive or operate machinery for the first twenty four hours after surgery.  Do not make any important decisions for twenty four hours after surgery or while taking narcotic pain medications or sedatives.  If you develop intractable nausea and vomiting or a severe headache please notify your doctor immediately.  FOLLOW-UP:  Please make an appointment with your surgeon as instructed. You do not need to follow up with anesthesia unless specifically instructed to do so.  WOUND CARE INSTRUCTIONS (if applicable):  Keep a dry clean dressing on the anesthesia/puncture wound site if there is drainage.  Once the wound has quit draining you may leave it open to air.  Generally you should leave the bandage intact for twenty four hours unless there is drainage.  If the epidural site drains for more than 36-48 hours please call the anesthesia department.  QUESTIONS?:  Please feel free to call your physician or the hospital operator if you have any questions, and they will be happy to assist you.     [facility] Anesthesia Department [address] [place] [phone]

## 2011-01-29 NOTE — Pre-Procedure Instructions (Signed)
EKG shown to Dr Jayme Cloud. No orders given. Will reasses ekg on arrival 02/03/2011-day of surgery.

## 2011-02-03 ENCOUNTER — Inpatient Hospital Stay (HOSPITAL_COMMUNITY)
Admission: RE | Admit: 2011-02-03 | Discharge: 2011-02-04 | DRG: 714 | Disposition: A | Discharge status: Home / Self Care | Payer: Medicare HMO | Source: Ambulatory Visit | Attending: Urology | Admitting: Urology

## 2011-02-03 ENCOUNTER — Encounter (HOSPITAL_COMMUNITY): Payer: Self-pay | Admitting: Anesthesiology

## 2011-02-03 ENCOUNTER — Encounter (HOSPITAL_COMMUNITY): Payer: Self-pay | Admitting: *Deleted

## 2011-02-03 ENCOUNTER — Encounter (HOSPITAL_COMMUNITY)
Admission: RE | Disposition: A | Discharge status: Home / Self Care | Payer: Self-pay | Source: Ambulatory Visit | Attending: Urology

## 2011-02-03 ENCOUNTER — Other Ambulatory Visit: Payer: Self-pay | Admitting: Urology

## 2011-02-03 DIAGNOSIS — K219 Gastro-esophageal reflux disease without esophagitis: Secondary | ICD-10-CM | POA: Diagnosis present

## 2011-02-03 DIAGNOSIS — N320 Bladder-neck obstruction: Secondary | ICD-10-CM | POA: Diagnosis present

## 2011-02-03 DIAGNOSIS — N138 Other obstructive and reflux uropathy: Principal | ICD-10-CM | POA: Diagnosis present

## 2011-02-03 DIAGNOSIS — R339 Retention of urine, unspecified: Secondary | ICD-10-CM | POA: Diagnosis present

## 2011-02-03 DIAGNOSIS — N401 Enlarged prostate with lower urinary tract symptoms: Principal | ICD-10-CM | POA: Diagnosis present

## 2011-02-03 HISTORY — PX: TRANSURETHRAL RESECTION OF PROSTATE: SHX73

## 2011-02-03 LAB — ABO/RH: ABO/RH(D): A POS

## 2011-02-03 SURGERY — TURP (TRANSURETHRAL RESECTION OF PROSTATE)
Anesthesia: Spinal | Wound class: Clean Contaminated

## 2011-02-03 MED ORDER — ONDANSETRON HCL 4 MG PO TABS: 4.0000 mg | ORAL_TABLET | Freq: Four times a day (QID) | ORAL | Status: DC | PRN

## 2011-02-03 MED ORDER — LACTATED RINGERS IV SOLN
INTRAVENOUS | Status: DC
  Administered 2011-02-03: 11:00:00 via INTRAVENOUS
  Administered 2011-02-03: 1000 mL via INTRAVENOUS

## 2011-02-03 MED ORDER — ONDANSETRON HCL 4 MG/2ML IJ SOLN: 4.0000 mg | Freq: Once | INTRAMUSCULAR | Status: DC | PRN

## 2011-02-03 MED ORDER — STERILE WATER FOR IRRIGATION IR SOLN
Status: DC | PRN
  Administered 2011-02-03: 1000 mL

## 2011-02-03 MED ORDER — GLYCINE 1.5 % IR SOLN
Status: DC | PRN
  Administered 2011-02-03: 39000 mL

## 2011-02-03 MED ORDER — BUPIVACAINE HCL 0.75 % IJ SOLN
INTRAMUSCULAR | Status: DC | PRN
  Administered 2011-02-03: 15 mg via INTRATHECAL

## 2011-02-03 MED ORDER — GLYCINE 1.5 % IR SOLN
Status: DC | PRN
  Administered 2011-02-03: 9000 mL

## 2011-02-03 MED ORDER — MIDAZOLAM HCL 2 MG/2ML IJ SOLN
INTRAMUSCULAR | Status: AC
  Administered 2011-02-03: 2 mg via INTRAVENOUS
  Filled 2011-02-03: qty 2

## 2011-02-03 MED ORDER — FENTANYL CITRATE 0.05 MG/ML IJ SOLN
INTRAMUSCULAR | Status: AC
  Filled 2011-02-03: qty 2

## 2011-02-03 MED ORDER — MIDAZOLAM HCL 5 MG/5ML IJ SOLN
INTRAMUSCULAR | Status: DC | PRN
  Administered 2011-02-03: 2 mg via INTRAVENOUS

## 2011-02-03 MED ORDER — MIDAZOLAM HCL 2 MG/2ML IJ SOLN
INTRAMUSCULAR | Status: AC
  Filled 2011-02-03: qty 2

## 2011-02-03 MED ORDER — PANTOPRAZOLE SODIUM 40 MG PO TBEC
40.0000 mg | DELAYED_RELEASE_TABLET | Freq: Every day | ORAL | Status: DC
  Administered 2011-02-03 – 2011-02-04 (×2): 40 mg via ORAL
  Filled 2011-02-03 (×2): qty 1

## 2011-02-03 MED ORDER — MIDAZOLAM HCL 2 MG/2ML IJ SOLN
1.0000 mg | INTRAMUSCULAR | Status: AC | PRN
Start: 2011-02-03 — End: 2011-02-03
  Administered 2011-02-03 (×3): 2 mg via INTRAVENOUS

## 2011-02-03 MED ORDER — EPHEDRINE SULFATE 50 MG/ML IJ SOLN
INTRAMUSCULAR | Status: DC | PRN
  Administered 2011-02-03 (×3): 10 mg via INTRAVENOUS

## 2011-02-03 MED ORDER — CIPROFLOXACIN IN D5W 200 MG/100ML IV SOLN: 200.0000 mg | Freq: Once | INTRAVENOUS | Status: DC

## 2011-02-03 MED ORDER — FENTANYL CITRATE 0.05 MG/ML IJ SOLN: 25.0000 ug | INTRAMUSCULAR | Status: DC | PRN

## 2011-02-03 MED ORDER — CIPROFLOXACIN IN D5W 400 MG/200ML IV SOLN
INTRAVENOUS | Status: DC | PRN
  Administered 2011-02-03: 200 mg via INTRAVENOUS

## 2011-02-03 MED ORDER — EPHEDRINE SULFATE 50 MG/ML IJ SOLN
INTRAMUSCULAR | Status: AC
  Filled 2011-02-03: qty 1

## 2011-02-03 MED ORDER — FENTANYL CITRATE 0.05 MG/ML IJ SOLN
INTRAMUSCULAR | Status: DC | PRN
  Administered 2011-02-03: 50 ug via INTRAVENOUS
  Administered 2011-02-03: 25 ug via INTRATHECAL

## 2011-02-03 MED ORDER — CIPROFLOXACIN IN D5W 200 MG/100ML IV SOLN
200.0000 mg | Freq: Once | INTRAVENOUS | Status: DC
  Filled 2011-02-03: qty 100

## 2011-02-03 MED ORDER — PROPOFOL 10 MG/ML IV EMUL
INTRAVENOUS | Status: AC
  Filled 2011-02-03: qty 20

## 2011-02-03 MED ORDER — LIDOCAINE HCL (PF) 1 % IJ SOLN
INTRAMUSCULAR | Status: AC
  Filled 2011-02-03: qty 5

## 2011-02-03 MED ORDER — PROPOFOL 10 MG/ML IV EMUL
INTRAVENOUS | Status: DC | PRN
  Administered 2011-02-03: 25 ug/kg/min via INTRAVENOUS

## 2011-02-03 MED ORDER — MORPHINE SULFATE 2 MG/ML IJ SOLN
2.0000 mg | INTRAMUSCULAR | Status: DC | PRN
  Administered 2011-02-03: 2 mg via INTRAVENOUS
  Filled 2011-02-03: qty 1

## 2011-02-03 MED ORDER — DEXTROSE-NACL 5-0.45 % IV SOLN
INTRAVENOUS | Status: DC
  Administered 2011-02-03 (×2): via INTRAVENOUS

## 2011-02-03 MED ORDER — CIPROFLOXACIN IN D5W 200 MG/100ML IV SOLN
INTRAVENOUS | Status: AC
  Filled 2011-02-03: qty 100

## 2011-02-03 MED ORDER — ONDANSETRON HCL 4 MG/2ML IJ SOLN: 4.0000 mg | Freq: Four times a day (QID) | INTRAMUSCULAR | Status: DC | PRN

## 2011-02-03 SURGICAL SUPPLY — 36 items
BAG DECANTER FOR FLEXI CONT (MISCELLANEOUS) ×2 IMPLANT
BAG DRAIN URO TABLE W/ADPT NS (DRAPE) ×2 IMPLANT
BAG DRN 8 ADPR NS SKTRN CSTL (DRAPE) ×1
BAG DRN URN TUBE DRIP CHMBR (OSTOMY) ×1
BAG URINE DRAIN TURP 4L (OSTOMY) ×2 IMPLANT
CABLE HI FREQUENCY MONOPOLAR (ELECTROSURGICAL) ×2 IMPLANT
CATH 3WAY 30CC 24FR (CATHETERS) ×1 IMPLANT
CATH FOLEY 3WAY 30CC 22F (CATHETERS) ×1 IMPLANT
CLOTH BEACON ORANGE TIMEOUT ST (SAFETY) ×2 IMPLANT
CONNECTOR 5 IN 1 STRAIGHT STRL (MISCELLANEOUS) ×2 IMPLANT
DRAPE STERI URO 23X35 APER SZ5 (DRAPE) ×2 IMPLANT
ELECT CUT LOOP C-MAX 27FR .012 (CUTTING LOOP) ×2
ELECT REM PT RETURN 9FT ADLT (ELECTROSURGICAL) ×2
ELECTRODE CUT LP CMX 27FR .012 (CUTTING LOOP) IMPLANT
ELECTRODE REM PT RTRN 9FT ADLT (ELECTROSURGICAL) ×1 IMPLANT
FLOOR PAD 36X40 (MISCELLANEOUS)
FORMALIN 10 PREFIL 480ML (MISCELLANEOUS) ×2 IMPLANT
GLOVE BIO SURGEON STRL SZ7 (GLOVE) ×2 IMPLANT
GLOVE ECLIPSE 6.5 STRL STRAW (GLOVE) ×1 IMPLANT
GLOVE EXAM NITRILE MD LF STRL (GLOVE) ×1 IMPLANT
GLOVE INDICATOR 7.0 STRL GRN (GLOVE) ×2 IMPLANT
GLYCINE 1.5% IRRIG UROMATIC (IV SOLUTION) ×20 IMPLANT
GOWN BRE IMP SLV AUR XL STRL (GOWN DISPOSABLE) ×1 IMPLANT
GOWN STRL REIN XL XLG (GOWN DISPOSABLE) ×2 IMPLANT
IV NS IRRIG 3000ML ARTHROMATIC (IV SOLUTION) ×2 IMPLANT
KIT ROOM TURNOVER AP CYSTO (KITS) ×2 IMPLANT
MANIFOLD NEPTUNE II (INSTRUMENTS) ×2 IMPLANT
PACK CYSTO (CUSTOM PROCEDURE TRAY) ×2 IMPLANT
PAD ARMBOARD 7.5X6 YLW CONV (MISCELLANEOUS) ×2 IMPLANT
PAD FLOOR 36X40 (MISCELLANEOUS) ×1 IMPLANT
SET IRRIGATING DISP (SET/KITS/TRAYS/PACK) ×2 IMPLANT
SYR 30ML LL (SYRINGE) ×2 IMPLANT
TOWEL OR 17X26 4PK STRL BLUE (TOWEL DISPOSABLE) ×2 IMPLANT
WATER STERILE IRR 1000ML POUR (IV SOLUTION) ×2 IMPLANT
XPEEDA 550 SIDEFIRING FIBER (MISCELLANEOUS) IMPLANT
YANKAUER SUCT BULB TIP 10FT TU (MISCELLANEOUS) ×2 IMPLANT

## 2011-02-03 NOTE — OR Nursing (Signed)
1043 - foley bag removed by Elon Spanner, RN, 100 ml yellow urine for output

## 2011-02-03 NOTE — Anesthesia Postprocedure Evaluation (Signed)
  Anesthesia Post-op Note  Patient: Mark Morrison  Procedure(s) Performed:  TRANSURETHRAL RESECTION OF THE PROSTATE (TURP)  Patient Location: PACU  Anesthesia Type: Spinal  Level of Consciousness: awake, alert  and oriented  Airway and Oxygen Therapy: Patient Spontanous Breathing and Patient connected to nasal cannula oxygen  Post-op Pain: none  Post-op Assessment: Post-op Vital signs reviewed, Patient's Cardiovascular Status Stable, Respiratory Function Stable and No signs of Nausea or vomiting  Post-op Vital Signs: Reviewed and stable  Complications: No apparent anesthesia complications

## 2011-02-03 NOTE — H&P (Signed)
NAME:  Mark Morrison, Mark Morrison NO.:  [id number]  MEDICAL RECORD NO.:  [id number]  LOCATION:                                 FACILITY:  PHYSICIAN:  Ky Barban, M.D.DATE OF BIRTH:  03/03/1945  DATE OF ADMISSION: DATE OF DISCHARGE:  LH                             HISTORY & PHYSICAL   CHIEF COMPLAINT:  Recurrent urinary retention.  A 65 year old gentleman has a longstanding history of prostatism, had at least 3 episodes of urinary retention, has been treated with Flomax and he is in retention again.  Dr. Rito Ehrlich scoped him in the office and has BPH with bladder neck obstruction.  The patient was referred to me for TUR prostate.  I did not cystoscope them but the history, I reviewed his records from before 2004.  He had elevated PSA.  He was being followed by Dr. Brunilda Payor in [place], [facility].  He had done a prostate biopsy which was negative.  He has been taking Flomax regularly but he keeps going into retention for which he has been advised to undergo TUR prostate for which he is coming as outpatient.  Procedure, limitation, complications discussed with the patient and his wife.  They understand, want me to proceed.  He has no other significant medical problem.  He has colectomy for ulcerative colitis in 2004.  Subsequently he developed deep vein thrombosis at that time, but no problems since then.  His only other problem is that he developed diarrhea.  He has been told that it is because of the tight anal sphincter.  Dr. Duncan Dull is our gastroenterologist who has recommended that he probably need to have dilation of the sphincter.  He had no other surgical procedure.  FAMILY HISTORY:  No history of prostate cancer.  PERSONAL HISTORY:  He is married, lives with his wife.  He does not drink alcohol but does smoke still.  REVIEW OF SYSTEMS:  Unremarkable.  I see his last PSA was on December 24, 2009, 15.27 and which is almost several years ago in the  similar range.  PHYSICAL EXAMINATION:  VITAL SIGNS:  His blood pressure is 130/80, temperature is normal. CENTRAL NERVOUS SYSTEM:  No gross neurological deficit. HEAD, NECK, ENT:  Negative. CHEST:  Symmetrical. HEART:  Regular sinus rhythm.  No murmur. ABDOMEN:  Soft, flat.  Liver, spleen, kidneys not palpable. EXTERNAL GENITALIA:  Circumcised, meatus adequate.  Has Foley catheter in place RECTAL:  Deferred. EXTREMITIES:  Normal.  IMPRESSION: 1. Acute urinary retention from benign prostatic hypertrophy. 2. Elevated PSA.  Other diagnoses include gastroesophageal reflux     disease and status post colectomy for ulcerative colitis.  He is     coming as outpatient in the morning.  We will do his TUR prostate.     Ky Barban, M.D.     MIJ/MEDQ  D:  02/02/2011  T:  02/03/2011  Job:  [id number]

## 2011-02-03 NOTE — Progress Notes (Signed)
MD in requested type and cross for 2 units prior to surgery. Done sent to lab

## 2011-02-03 NOTE — Anesthesia Preprocedure Evaluation (Addendum)
Anesthesia Evaluation  Patient identified by MRN, date of birth, ID band Patient awake    Reviewed: Allergy & Precautions, H&P , NPO status , Patient's Chart, lab work & pertinent test results  History of Anesthesia Complications Negative for: history of anesthetic complications  Airway Mallampati: I      Dental  (+) Edentulous Upper and Edentulous Lower   Pulmonary Current Smoker (am cough),    Pulmonary exam normal       Cardiovascular hypertension (no meds now), Regular Normal DVT's after APR surgery   Neuro/Psych    GI/Hepatic PUD, GERD-  Medicated and Controlled,  Endo/Other    Renal/GU      Musculoskeletal   Abdominal   Peds  Hematology   Anesthesia Other Findings   Reproductive/Obstetrics                           Anesthesia Physical Anesthesia Plan  ASA: III  Anesthesia Plan: Spinal   Post-op Pain Management:    Induction: Intravenous  Airway Management Planned: Nasal Cannula  Additional Equipment:   Intra-op Plan:   Post-operative Plan:   Informed Consent: I have reviewed the patients History and Physical, chart, labs and discussed the procedure including the risks, benefits and alternatives for the proposed anesthesia with the patient or authorized representative who has indicated his/her understanding and acceptance.     Plan Discussed with:   Anesthesia Plan Comments:         Anesthesia Quick Evaluation

## 2011-02-03 NOTE — Anesthesia Procedure Notes (Signed)
Spinal  Patient location during procedure: OR Start time: 02/03/2011 10:40 AM Staffing CRNA/Resident: Glynn Octave Preanesthetic Checklist Completed: patient identified, site marked, surgical consent, pre-op evaluation, timeout performed, IV checked, risks and benefits discussed and monitors and equipment checked Spinal Block Patient position: left lateral decubitus Prep: Betadine Patient monitoring: heart rate, cardiac monitor, continuous pulse ox and blood pressure Approach: left paramedian Location: L4-5 Injection technique: single-shot Needle Needle type: Quincke  Needle gauge: 22 G Needle length: 9 cm Assessment Sensory level: T8 Additional Notes Lot #[id number], exp. Date 09/2011

## 2011-02-03 NOTE — Transfer of Care (Signed)
Immediate Anesthesia Transfer of Care Note  Patient: Mark Morrison  Procedure(s) Performed:  TRANSURETHRAL RESECTION OF THE PROSTATE (TURP)  Patient Location: PACU  Anesthesia Type: Spinal  Level of Consciousness: awake, alert  and oriented  Airway & Oxygen Therapy: Patient Spontanous Breathing and Patient connected to nasal cannula oxygen  Post-op Assessment: Report given to PACU RN  Post vital signs: Reviewed and stable  Complications: No apparent anesthesia complications

## 2011-02-03 NOTE — Brief Op Note (Signed)
02/03/2011  12:20 PM  PATIENT:  Mark Morrison  65 y.o. male  PRE-OPERATIVE DIAGNOSIS:  Benign Prostatic Hypertrophy  POST-OPERATIVE DIAGNOSIS:  Benign Prostatic Hypertrophy  PROCEDURE:  Procedure(s): TRANSURETHRAL RESECTION OF THE PROSTATE (TURP)  SURGEON:  Surgeon(s): Ky Barban  PHYSICIAN ASSISTANT:   ASSISTANTS: none   ANESTHESIA:   spinal  EBL:  Total I/O In: 1000 [I.V.:1000] Out: 100 [Urine:100]  BLOOD ADMINISTERED:none  DRAINS: Urinary Catheter (Foley)   LOCAL MEDICATIONS USED:  NONE  SPECIMEN:  Source of Specimen:  prostate chips  DISPOSITION OF SPECIMEN:  PATHOLOGY  COUNTS:  YES  TOURNIQUET:  * No tourniquets in log *  DICTATION: .Other Dictation: Dictation Number report#[id number]  PLAN OF CARE: Admit for overnight observation  PATIENT DISPOSITION:  PACU - hemodynamically stable.   Delay start of Pharmacological VTE agent (>24hrs) due to surgical blood loss or risk of bleeding:  {YES

## 2011-02-03 NOTE — Progress Notes (Signed)
Pt reexamined and no change in H& P.

## 2011-02-04 LAB — BASIC METABOLIC PANEL
BUN: 9 mg/dL (ref 6–23)
Calcium: 8.5 mg/dL (ref 8.4–10.5)
Creatinine, Ser: 0.8 mg/dL (ref 0.50–1.35)
GFR calc Af Amer: >90 mL/min (ref >90)
GFR calc non Af Amer: >90 mL/min (ref >90)
Potassium: 4.1 mEq/L (ref 3.5–5.1)

## 2011-02-04 LAB — CBC
Hemoglobin: 11 g/dL — ABNORMAL LOW (ref 13.0–17.0)
MCHC: 34 g/dL (ref 30.0–36.0)
Platelets: 98 10*3/uL — ABNORMAL LOW (ref 150–400)
RDW: 15.8 % — ABNORMAL HIGH (ref 11.5–15.5)

## 2011-02-04 MED ORDER — OXYCODONE-ACETAMINOPHEN 5-325 MG PO TABS: 1.0000 | ORAL_TABLET | ORAL | Status: AC | PRN

## 2011-02-04 NOTE — Progress Notes (Signed)
D/C'd pts CBI; pt tolerated well. Leg bag in place and instructions given to patient and wife

## 2011-02-04 NOTE — Progress Notes (Signed)
Report#[id number]

## 2011-02-04 NOTE — Addendum Note (Signed)
Addendum  created 02/04/11 1130 by Corena Pilgrim, CRNA   Modules edited:Notes Section

## 2011-02-04 NOTE — Op Note (Signed)
NAME:  SHERRON, MUMMERT NO.:  [id number]  MEDICAL RECORD NO.:  [id number]  LOCATION:  A212                          FACILITY:  [facility]  PHYSICIAN:  Ky Barban, M.D.DATE OF BIRTH:  1945-12-29  DATE OF PROCEDURE: DATE OF DISCHARGE:                              OPERATIVE REPORT   PREOPERATIVE DIAGNOSES: 1. Acute urinary retention. 2. Benign prostatic hypertrophy.  POSTOPERATIVE DIAGNOSES: 1. Acute urinary retention. 2. Benign prostatic hypertrophy.  PROCEDURE:  TUR prostate.  ANESTHESIA:  Spinal.  PROCEDURE:  The patient under spinal anesthesia, after usual prep and drape, a #28 Iglesias resectoscope was introduced into the bladder.  It was inspected.  He has a huge prostate.  Prostatic urethra is at least 6 cm long and a very long and large median lobe, so I proceeded to resect the median lobe and was completely resected to the mid prostatic urethra.  Then, the resectoscope was pulled back at the level of the verumontanum rotated to 11 o'clock position.  The right lobe was resected between 11 and 7 o'clock positions.  There was a large amount of tissue in the anterior midline and also in the lateral lobes, and I was able to resect most of it but there is still considerable tissue in the right lobe.  Once the right lobe was resected visually and I proceeded to resect the left lobe.  It was resected between 1 and 5 o'clock positions.  Again, there is still considerable tissue in the left lateral lobe.  The area near the apex was really large adenoma which was resected.  The prostatic urethra looks open at this point. There was some tissue I removed from the anterior midline and the posterior midline at this point, but there is still considerable tissue in the prostatic urethra, but there was no visual obstruction.  The chips were evacuated.  Bleeders were coagulated.  He lost about couple of 100 mL of blood, remained stable and at this point, the  resectoscope was removed, 24 three-way Foley catheter left in for drainage.  CBI was started which was clear.  The patient left the operating room in satisfactory condition.    Ky Barban, M.D.    MIJ/MEDQ  D:  02/03/2011  T:  02/04/2011  Job:  [id number]

## 2011-02-04 NOTE — Progress Notes (Signed)
Pt discharged home via family; Pt and family given and explained all discharge instructions, carenotes, and prescriptions; pt and family stated understanding and denied questions/concerns; all f/u appointments in place; IV removed without complicaitons; pt stable at time of discharge  

## 2011-02-04 NOTE — Progress Notes (Signed)
CARE MANAGEMENT NOTE 02/04/2011  Patient:  Mark Morrison, Mark Morrison   Account Number:  [id number]  Date Initiated:  02/04/2011  Documentation initiated by:  Rosemary Holms  Subjective/Objective Assessment:   Pt admitted with bph. PTA, lived at home with wife and independent w/ ADLs.     Action/Plan:   Pt and spouse state they do not have HH needs at this time. Anticipate DC today.   Anticipated DC Date:  02/04/2011   Anticipated DC Plan:  HOME/SELF CARE      DC Planning Services  CM consult      Choice offered to / List presented to:             Status of service:  In process, will continue to follow Medicare Important Message given?  YES (If response is "NO", the following Medicare IM given date fields will be blank) Date Medicare IM given:  02/04/2011 Date Additional Medicare IM given:    Discharge Disposition:    Per UR Regulation:    Comments:  02/04/11/ 10:50 Madora Barletta Leanord Hawking RN BSN

## 2011-02-04 NOTE — Anesthesia Postprocedure Evaluation (Signed)
  Anesthesia Post-op Note  Patient: Mark Morrison  Procedure(s) Performed:  TRANSURETHRAL RESECTION OF THE PROSTATE (TURP)  Patient Location: Room212 Anesthesia Type: Spinal  Level of Consciousness: awake, alert , oriented and patient cooperative  Airway and Oxygen Therapy: Patient Spontanous Breathing  Post-op Pain: mild  Post-op Assessment: Post-op Vital signs reviewed  Post-op Vital Signs: stable  Complications: No apparent anesthesia complications

## 2011-02-04 NOTE — Discharge Summary (Signed)
Report#[id number]

## 2011-02-05 NOTE — Consult Note (Signed)
NAME:  Mark Morrison, Mark Morrison NO.:  [id number]  MEDICAL RECORD NO.:  [id number]  LOCATION:                                 FACILITY:  PHYSICIAN:  Ky Barban, M.D.DATE OF BIRTH:  [birth year]  DATE OF CONSULTATION: DATE OF DISCHARGE:                                CONSULTATION   A 65 year old gentleman with longstanding history of prostatism and acute urinary retention.  Patient of Dr. Rito Ehrlich who was referred to me for TUR prostate.  He has cystoscoped him in the office again and has found that he has BPH and finally the patient who has several episodes of retention before had been treated with Flomax and decided to go ahead and have something done about it.  In 2004, he had a prostate biopsy for elevated PSA by Dr. Brunilda Payor in [place].  Biopsy was negative.  So he came as outpatient after having routine admission workup.  CBC, BMET, urinalysis, all values were normal.  He was taken to the operating room on December 11, underwent TUR prostate.  He has rather very large prostate, but I was able to remove the visual obstruction.  I will give him a voiding trial when he comes to the office on Monday.  First postop day, his urine is clear.  His lab values are as following today:  Sodium 133, potassium 4.1, chloride 100, CO2 is 29, BUN is 9, creatinine 0.8. His WBC count is 4000, hematocrit 32.4.  Preoperatively it was 35.5. His urine is clear.  So I am going to discharge him.  He is up and walking around.  I will send him home with Foley catheter, see him on Monday in the office, give him a voiding trial.  His pathology report is still pending.  FINAL DISCHARGE DIAGNOSIS:  Benign prostatic hypertrophy.  DISCHARGE CONDITION:  Improved.  DISCHARGE MEDICATION:  Percocet 1 q.6 hours p.r.n., #30.  I told him to resume his home medications.  I do not need to give him anything else, to let me know if he has any bleeding or fever over 101.     Ky Barban,  M.D.     MIJ/MEDQ  D:  02/04/2011  T:  02/05/2011  Job:  [id number]

## 2011-02-05 NOTE — Progress Notes (Signed)
NAME:  Mark Morrison, LIKES NO.:  [id number]  MEDICAL RECORD NO.:  [id number]  LOCATION:  A212                          FACILITY:  [facility]  PHYSICIAN:  Ky Barban, M.D.DATE OF BIRTH:  08-15-1945  DATE OF PROCEDURE: DATE OF DISCHARGE:  02/04/2011                                PROGRESS NOTE   This is first postop day, general status is good.  He is all dressed up, sitting in the chair.  Have no complaints.  Urine is clear.  He is afebrile.  His lab workup from this morning, sodium is 133, potassium 4.1, chloride 100, CO2 is 28, BUN is 9, creatinine 0.8.  His WBC count is 4000, hematocrit of 32.4.  His abdomen is soft, flat.  Liver, spleen, kidneys are not palpable. Her temperature is 97.3, blood pressure is 113/71.  O2 saturation is 99.  I am planning to discharge him home today with Foley catheter.  I gave him the instruction that if he has fever over 101, already started to have bleeding to let me, otherwise, I am going to see him back in the office where I will take his catheter out of it.  Giving a voiding trial.  His pathology report is still pending.  FINAL DISCHARGE DIAGNOSIS:  Benign prostatic hypertrophy.  DISCHARGE MEDICATIONS:  I gave him a prescription of Percocet 1 q.6 hours p.r.n., #30, and I do not need to give him anything else.  He can resume his home meds.     Ky Barban, M.D.     MIJ/MEDQ  D:  02/04/2011  T:  02/05/2011  Job:  [id number]

## 2011-02-07 LAB — TYPE AND SCREEN: Unit division: 0

## 2011-02-09 ENCOUNTER — Encounter (HOSPITAL_COMMUNITY): Payer: Self-pay | Admitting: Urology

## 2011-02-23 ENCOUNTER — Encounter: Payer: Self-pay | Admitting: Internal Medicine

## 2011-02-23 ENCOUNTER — Ambulatory Visit (INDEPENDENT_AMBULATORY_CARE_PROVIDER_SITE_OTHER): Payer: Medicare HMO | Admitting: Gastroenterology

## 2011-02-23 VITALS — BP 125/67 | HR 103 | Temp 98.6°F | Ht 63.0 in | Wt 166.0 lb

## 2011-02-23 DIAGNOSIS — R197 Diarrhea, unspecified: Secondary | ICD-10-CM

## 2011-02-23 DIAGNOSIS — K2270 Barrett's esophagus without dysplasia: Secondary | ICD-10-CM

## 2011-02-23 DIAGNOSIS — K91850 Pouchitis: Secondary | ICD-10-CM

## 2011-02-23 DIAGNOSIS — D61818 Other pancytopenia: Secondary | ICD-10-CM

## 2011-02-23 DIAGNOSIS — K5190 Ulcerative colitis, unspecified, without complications: Secondary | ICD-10-CM

## 2011-02-23 MED ORDER — ALIGN 4 MG PO CAPS: 4.0000 mg | ORAL_CAPSULE | Freq: Every day | ORAL | Status: DC

## 2011-02-23 MED ORDER — PANTOPRAZOLE SODIUM 40 MG PO TBEC: 40.0000 mg | DELAYED_RELEASE_TABLET | Freq: Every day | ORAL | Status: DC

## 2011-02-23 MED ORDER — METRONIDAZOLE 500 MG PO TABS: 500.0000 mg | ORAL_TABLET | Freq: Three times a day (TID) | ORAL | Status: AC

## 2011-02-23 NOTE — Assessment & Plan Note (Signed)
He is due LFTs at this time to monitor for development of PSC. Will order after discuss pancytopenia with Dr. Darrick Penna.

## 2011-02-23 NOTE — Patient Instructions (Signed)
Please start Flagyl three times a day for 10 days. Please start Align, one daily for four weeks. RX for protonix sent to your pharmacy. Please collect stool for testing as soon as you can. Please call with a progress report in two weeks. I will discuss your most recent labs with Dr. Darrick Penna and let you know if any further testing needed.

## 2011-02-23 NOTE — Assessment & Plan Note (Signed)
Continue PPI indefinetly. RX sent to [facility] for pantoprazole. Due surveillance EGD 02/2011. Will discuss with him when we go over pending stool studies.

## 2011-02-23 NOTE — Assessment & Plan Note (Signed)
To discuss with Dr. Darrick Penna. WBC low normal but platelet count and H/H low. Last imaging of abd was one year ago. No evidence of cirrhosis or splenomegaly at that time. Further recommendations to follow.

## 2011-02-23 NOTE — Progress Notes (Signed)
Primary Care Physician: Londell Moh, MD, MD  Primary Gastroenterologist:  Jonette Eva, MD   Chief Complaint  Patient presents with  . Diarrhea    HPI: Mark Morrison is a 65 y.o. male here for further evaluation of recurrent diarrhea. He has h/o UC s/p proctocolectomy with ileal anastomosis and anal pouch creation. H/O recurrent pouchitis requiring periodic antibiotics with Augmentin or flagyl. States he never responded to topical mesalamine.  Last EGD/ileosocopy as outlined below.  C/O worsening diarrhea for past two months. Recently on antibiotic for two weeks prior to TURP.  BM 12+ per day more recently. Feels like all of it never comes out. If eats the wrong thing then lots of runny stool. Eggs, sausage, greasy foods, ice cream seems to cause diarrhea. Drinks lactose free milk. No abdominal pain. No fever. Appetite good. Stools worse the last couple of months. Weight down few more pounds the last couple of months.   States his urologist advised again hyomax.   Lab Results  Component Value Date   WBC 4.0 02/04/2011   HGB 11.0* 02/04/2011   HCT 32.4* 02/04/2011   MCV 103.8* 02/04/2011   PLT 98* 02/04/2011     Lab Results  Component Value Date   ALT <8 U/L 03/04/2010   AST 12 03/04/2010   ALKPHOS 50 03/04/2010   BILITOT 0.6 03/04/2010     Current Outpatient Prescriptions  Medication Sig Dispense Refill  . Cyanocobalamin (VITAMIN B-12 IJ) Inject as directed every 3 (three) months.        . fish oil-omega-3 fatty acids 1000 MG capsule Take 2 g by mouth daily.       . Multiple Vitamin (MULTIVITAMIN) capsule Take 1 capsule by mouth daily.        . pantoprazole (PROTONIX) 40 MG tablet Take 40 mg by mouth daily.        . vitamin B-12 (CYANOCOBALAMIN) 1000 MCG tablet Take 1,000 mcg by mouth daily.        . vitamin C (ASCORBIC ACID) 500 MG tablet Take 500 mg by mouth daily.          Allergies as of 02/23/2011  . (No Known Allergies)   Past Medical History  Diagnosis  Date  . Ulcerative colitis 2004    with IPPA  . 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 History of DVT (deep vein thrombosis)     and PE  . Barrett's esophagus 02/2010    on EGD  . Vitamin B 12 deficiency   . BPH (benign prostatic hyperplasia)   . GERD (gastroesophageal reflux disease)    Past Surgical History  Procedure Date  . Knee surgery     Left  . Abdominoperineal proctocolectomy 2004    with ileal anastomosis and anal pouch creation at [facility]  . Colonoscopy 11/22/2006    ULCERATIVE COLITIS  . Transurethral resection of prostate 02/03/2011    Procedure: TRANSURETHRAL RESECTION OF THE PROSTATE (TURP);  Surgeon: Ky Barban;  Location: [facility];  Service: Urology;  Laterality: N/A;  . Ileoscopy 03/14/2010    geographic ulceratios of the ileal pouch c/w pouchitis, more proximal distal ileum mucosa normal, biospy from ulcer benign  . Esophagogastroduodenoscopy 03/14/10    barret's esophagus, small hh, erosive duodenitis    ROS:  General: Negative for anorexia, weight loss, fever, chills, fatigue, weakness. ENT: Negative for hoarseness, difficulty swallowing , nasal congestion. CV: Negative for chest pain, angina, palpitations, dyspnea on exertion, peripheral edema.  Respiratory: Negative for  dyspnea at rest, dyspnea on exertion, cough, sputum, wheezing.  GI: See history of present illness. GU:  Negative for dysuria, hematuria, urinary incontinence, urinary frequency, nocturnal urination.  Endo: Negative for unusual weight change.    Physical Examination:   BP 125/67  Pulse 103  Temp(Src) 98.6 F (37 C) (Temporal)  Ht 5\' 3"  (1.6 m)  Wt 166 lb (75.297 kg)  BMI 29.41 kg/m2  General: Well-nourished, well-developed in no acute distress.  Eyes: No icterus. Mouth: Oropharyngeal mucosa moist and pink , no lesions erythema or exudate. Lungs: Clear to auscultation bilaterally.  Heart: Regular rate and rhythm, no murmurs rubs or gallops.  Abdomen: Bowel sounds are  normal, nontender, nondistended, no hepatosplenomegaly or masses, no abdominal bruits or hernia , no rebound or guarding.   Extremities: No lower extremity edema. No clubbing or deformities. Neuro: Alert and oriented x 4   Skin: Warm and dry, no jaundice.   Psych: Alert and cooperative, normal mood and affect.

## 2011-02-23 NOTE — Assessment & Plan Note (Signed)
Likely pouchitis but need to rule out other etiologies. Check stool studies. Add probiotics. Flagyl 500mg  tid for 10 days. PR in two weeks.

## 2011-02-23 NOTE — Progress Notes (Signed)
Cc to PCP 

## 2011-03-03 NOTE — Progress Notes (Signed)
REFER TO [facility] FOR PANCYTOPENIA.  REVIEWED.

## 2011-03-06 LAB — STOOL CULTURE

## 2011-03-23 NOTE — Progress Notes (Signed)
Quick Note:  Called and informed pt's wife. Aware he needs referral to Dr. Mariel Sleet and aware will be scheduled to see Dr. Darrick Penna in couple of weeks. ______

## 2011-03-23 NOTE — Progress Notes (Signed)
Quick Note:  Please call patient and let him know stool studies were negative. He has some loose ends: Due for EGD for surveillance of Barrett's. Due for LFTs. He has pancytopenia (ie WBC, H/H, Platelets all a little low): Dr. Darrick Penna wants him to see hematologist (Dr. Mariel Sleet). Please schedule OV with [facility] in two weeks to schedule EGD and f/u labs, pouchitis. Thanks. ______

## 2011-03-24 NOTE — Progress Notes (Signed)
Quick Note:  Pt needs appt in 2 weeks with Dr. Darrick Penna. ______

## 2011-03-27 ENCOUNTER — Encounter (HOSPITAL_COMMUNITY): Payer: Medicare HMO | Attending: Oncology | Admitting: Oncology

## 2011-03-27 ENCOUNTER — Encounter (HOSPITAL_COMMUNITY): Payer: Self-pay | Admitting: Oncology

## 2011-03-27 DIAGNOSIS — D696 Thrombocytopenia, unspecified: Secondary | ICD-10-CM

## 2011-03-27 DIAGNOSIS — D649 Anemia, unspecified: Secondary | ICD-10-CM

## 2011-03-27 DIAGNOSIS — Z860100 Personal history of colon polyps, unspecified: Secondary | ICD-10-CM | POA: Insufficient documentation

## 2011-03-27 DIAGNOSIS — F17200 Nicotine dependence, unspecified, uncomplicated: Secondary | ICD-10-CM | POA: Insufficient documentation

## 2011-03-27 DIAGNOSIS — Z8673 Personal history of transient ischemic attack (TIA), and cerebral infarction without residual deficits: Secondary | ICD-10-CM | POA: Insufficient documentation

## 2011-03-27 DIAGNOSIS — N400 Enlarged prostate without lower urinary tract symptoms: Secondary | ICD-10-CM | POA: Insufficient documentation

## 2011-03-27 DIAGNOSIS — D61818 Other pancytopenia: Secondary | ICD-10-CM | POA: Insufficient documentation

## 2011-03-27 DIAGNOSIS — D619 Aplastic anemia, unspecified: Secondary | ICD-10-CM

## 2011-03-27 DIAGNOSIS — R799 Abnormal finding of blood chemistry, unspecified: Secondary | ICD-10-CM

## 2011-03-27 DIAGNOSIS — E538 Deficiency of other specified B group vitamins: Secondary | ICD-10-CM | POA: Insufficient documentation

## 2011-03-27 DIAGNOSIS — K219 Gastro-esophageal reflux disease without esophagitis: Secondary | ICD-10-CM | POA: Insufficient documentation

## 2011-03-27 DIAGNOSIS — K2270 Barrett's esophagus without dysplasia: Secondary | ICD-10-CM | POA: Insufficient documentation

## 2011-03-27 DIAGNOSIS — D72819 Decreased white blood cell count, unspecified: Secondary | ICD-10-CM

## 2011-03-27 DIAGNOSIS — R197 Diarrhea, unspecified: Secondary | ICD-10-CM

## 2011-03-27 DIAGNOSIS — Z86711 Personal history of pulmonary embolism: Secondary | ICD-10-CM | POA: Insufficient documentation

## 2011-03-27 LAB — CBC
MCH: 34.6 pg — ABNORMAL HIGH (ref 26.0–34.0)
MCHC: 34.2 g/dL (ref 30.0–36.0)
Platelets: 118 10*3/uL — ABNORMAL LOW (ref 150–400)
RDW: 15.3 % (ref 11.5–15.5)

## 2011-03-27 LAB — COMPREHENSIVE METABOLIC PANEL
CO2: 25 mEq/L (ref 19–32)
Calcium: 9.7 mg/dL (ref 8.4–10.5)
Creatinine, Ser: 0.78 mg/dL (ref 0.50–1.35)
GFR calc Af Amer: >90 mL/min (ref >90)
GFR calc non Af Amer: >90 mL/min (ref >90)
Glucose, Bld: 92 mg/dL (ref 70–99)
Total Bilirubin: 0.6 mg/dL (ref 0.3–1.2)

## 2011-03-27 LAB — DIFFERENTIAL
Eosinophils Relative: 0 % (ref 0–5)
Neutro Abs: 0.8 10*3/uL — ABNORMAL LOW (ref 1.7–7.7)
Neutrophils Relative %: 33 % — ABNORMAL LOW (ref 43–77)

## 2011-03-27 NOTE — Progress Notes (Signed)
[facility] NEW PATIENT EVALUATION   Name: Mark Morrison Date: 03/27/2011 MRN: [id number] DOB: 06-12-1945    CC: Londell Moh, MD, MD  Londell Moh,*   DIAGNOSIS: Diagnoses of ANEMIA, THROMBOCYTOPENIA, LEUKOPENIA, MILD, TOBACCO ABUSE, and Pancytopenia were pertinent to this visit.   HISTORY OF PRESENT ILLNESS:Mark Morrison is a 66 y.o. male who is has a past medical history significant for chronic blood count abnormalities, chronic diarrhea, S/P Colectomy for multiple polyps +/- Ulcerative colitis, and H/O PE in perioperative setting who was sent to the [facility] for evaluation of pancytopenia.  The patient reports that he is aware of a past history of abnormalities of his blood counts.  On further questioning, he reports a known history of leukopenia.  He explains that he has heard that from his physicians for over 40 years.  He has never been told that all of his counts were low.  The patient relays his story about his colectomy.  He reports that he had a sudden onset of diarrhea which eventually led him to a GI physician who performed a colonoscopy.  It was noted that he had many polyps, actually too many to remove.  He therefore underwent a colectomy with a pouch creation.  He does not have a colostomy bag.  He reports that the last inspection of his colon was performed by Dr. Darrick Penna 3-4 years ago.  He reports that he was told that his pouch was too small.  In the perioperative he experienced a PE.  He reports 3-4 years ago he was diagnosed with B12 deficiency.  He is now interestingly on B12 1000 mcg injections every 3 months and oral B12 daily.  If the patient is truly B12 deficient, the oral B12 would be a mute point because he will not absorb the medication.  Also, he would require monthly B12 injection.  The patient denies any B symptoms including fevers, chills, night sweats, appetite loss or change, and weight loss.  He denies any  headaches, dizziness, double vision, nausea, vomiting, constipation, chest pain, heart palpitations, shortness of breath, abdominal pain, blood in stool, black tarry stool, bleeding, rectal bleeding, gingival bleeding, easy bruisability, hematuria, urinary complaints, pain, and bone pain.   We discussed his need to quite smoking.  He qualifies for a low-dose spiral CT of chest to evaluate for occult malignancy due to his high risk for development of bronchogenic carcinoma.  He is noted to have clubbing of his fingernails.  An indication of COPD and/or lung disease.  I have encouraged him to quit smoking and he would like help with this.  I have suggested he decrease his smoking gradually.  When he is smoking 1/4 ppd, I would be happy to try nicotine patch or Chantix.   FAMILY HISTORY: family history is negative for Anesthesia problems, and Hypotension, and Malignant hyperthermia, and Pseudochol deficiency, .  The patient's mother is alive at the age of 45, but suffers from Alzheimer's disease.  The patient has been estranged from his father since birth and does not know much about his father or his father's side of the family.  The patient has 1/2 sister who is alive and well.  He has two children.  His son is 23 yo and his daughter is 42 yo.  Both are alive and well.  He has 5 grandchildren who are healthy.   PAST MEDICAL HISTORY:  has a past medical history of Ulcerative colitis (2004); Pouchitis (02/2010); History of  DVT (deep vein thrombosis); Barrett's esophagus (02/2010); Vitamin B 12 deficiency; BPH (benign prostatic hyperplasia); and GERD (gastroesophageal reflux disease).      CURRENT MEDICATIONS: See medication list   SOCIAL HISTORY:  reports that he has been smoking Cigarettes.  He has a 50 pack-year smoking history. He does not have any smokeless tobacco history on file. He reports that he does not drink alcohol or use illicit drugs.  The patient was born and presently resides in  [place].  He completed the 7th grade of school.  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  He has been married for 44 years and he and his wife live together at home.  He denies any EtOH or illicit drug use. He does admit to smoking 1 ppd x 50 years and reports that he used to smoke 2 ppd.  At the very least he has a 50+ pack year smoking history.    ALLERGIES: Review of patient's allergies indicates no known allergies.   LABORATORY DATA:  Results for WILLAM, MUNFORD (MRN [id number]) as of 03/27/2011 15:42  Ref. Range 01/11/2007 09:41 02/02/2007 09:59 03/04/2010 20:34 07/13/2010 09:02 01/29/2011 14:25 02/04/2011 05:45  WBC Latest Range: 4.0-10.5 K/uL 2.4 (L) 2.8 (L) 2.5 (L) 3.2 (L) 3.0 (L) 4.0  RBC Latest Range: 4.22-5.81 MIL/uL 3.54 (L) 3.54 (L) 3.03 (L) 3.12 (L) 3.43 (L) 3.12 (L)  HGB Latest Range: 13.0-17.0 g/dL 78.2 95.6 21.3 (L) 08.6 (L) 12.5 (L) 11.0 (L)  HCT Latest Range: 39.0-52.0 % 37.6 (L) 37.2 (L) 34.4 (L) 32.4 (L) 35.5 (L) 32.4 (L)  MCV Latest Range: 78.0-100.0 fL 106.2 (H) 105.1 (H) 113.5 (H) 103.8 (H) 103.5 (H) 103.8 (H)  MCH Latest Range: 26.0-34.0 pg    34.9 (H) 36.4 (H) 35.3 (H)  MCHC Latest Range: 30.0-36.0 g/dL 57.8 46.9 62.9 52.8 41.3 34.0  RDW Latest Range: 11.5-15.5 % 15.6 (H) 16.0 (H) 17.4 (H) 15.6 (H) 15.5 15.8 (H)  Platelets Latest Range: 150-400 K/uL 118 (L) 118 (L) 113 (L) 100 (L) 121 (L) 98 (L)  Neutrophils Relative Latest Range: 43-77 % 41 (L) 37 (L) 29 (L) 65 39 (L)   Lymphocytes Relative Latest Range: 12-46 % 35 37 31 9 (L) 25   Monocytes Relative Latest Range: 3-12 % 22 (H) 24 (H) 40 (H) 26 (H) 36 (H)   Eosinophils Relative Latest Range: 0-5 % 1 2 0 0 1   Basophils Relative Latest Range: 0-1 % 0 0 0 0 0   Neutrophils Absolute Latest Range: 1.7-7.7 K/uL 1.0 (L) 1.0 (L) 0.7 (L) 2.1 1.2 (L)   Lymphocytes Absolute Latest Range: 0.7-4.0 K/uL 0.8 1.1 0.8 0.3 (L) 0.7   Monocytes Absolute Latest Range: 0.1-1.0 K/uL 0.5 0.7 1.0 0.8 1.1  (H)   Eosinophils Absolute Latest Range: 0.0-0.7 K/uL 0.0 (L) 0.1 (L) 0.0 0.0 0.0   Basophils Absolute Latest Range: 0.0-0.1 K/uL 0.0 0.0 0.0 0.0 0.0   WBC Morphology No range found  ATYPICAL LYMPHOCYTES  ATYPICAL LYMPHOCY...    Smear Review No range found    PLATELET COUNT CO...    RBC. No range found  3.54 (L)      Retic Ct Pct No range found  3.5 (H)      Retic Count, Manual No range found  123.9           REVIEW OF SYSTEMS: Patient reports no health concerns.   PHYSICAL EXAM:  height is 5' 7.1" (1.704 m) and weight  is 164 lb 11.2 oz (74.707 kg). His oral temperature is 98 F (36.7 C). His blood pressure is 124/79 and his pulse is 87.  General appearance: alert, cooperative, appears older than stated age and no distress Head: Normocephalic, without obvious abnormality, atraumatic, right sided domed lesion just posterior to ear measuring 1.5 cm circular without erythema. Neck: no adenopathy, no carotid bruit, no JVD, supple, symmetrical, trachea midline and thyroid not enlarged, symmetric, no tenderness/mass/nodules Lymph nodes: Cervical, supraclavicular, and axillary nodes normal. Resp: clear to auscultation bilaterally and normal percussion bilaterally Back: symmetric, no curvature. ROM normal. No CVA tenderness. Cardio: regular rate and rhythm, S1, S2 normal, no murmur, click, rub or gallop GI: soft, non-tender; bowel sounds normal; no masses,  no organomegaly and large surgical scar from colectomy. Extremities: extremities normal, atraumatic, no cyanosis or edema and clubbing of fingernails Neurologic: Alert and oriented X 3, normal strength and tone. Normal symmetric reflexes. Normal coordination and gait   IMPRESSION:  1. Pancytopenia, with elevated MCV. 2. Diarrhea, followed by GI 3. BPH 4. Barrett's Esophagus 5. Tobacco abuse, 50+ pack years, clubbing of fingernails indicative of Lung disease. 6. H/O Colectomy for many polyps +/- Ulcerative colitis. 7. Questionable B12  deficiency, on every 3 month B12 injection and oral B12 daily   PLAN:  1. Lab work today: CBC diff, CMET, B12, Folate 2. CT CAP next week to evaluate for occult malignancy in light of 50+ pack years of tobacco smoking.  Also to evaluate for splenomegaly.  3. Discussed the potential need for a bone marrow biopsy and aspiration pending results of the above tests. 4. I have recommended he follow-up with his dermatologist regarding his right sided posterior to ear lesion that is domed and non 4. Return in 3 weeks for follow-up and discussion of above results.    All questions were answered.  Patient knows to call the clinic with any questions or concerns. Patient and plan discussed with Dr. Mariel Sleet and he is in agreement with the aforementioned.   Patient seen and examined by Dr. Glenford Peers.  Tristian Sickinger

## 2011-03-27 NOTE — Patient Instructions (Signed)
[facility]  Discharge Instructions Mark Morrison  [id number] Sep 06, 1945  RECOMMENDATIONS MADE BY THE CONSULTANT AND ANY TEST RESULTS WILL BE SENT TO YOUR REFERRING DOCTOR.   EXAM FINDINGS BY MD TODAY AND SIGNS AND SYMPTOMS TO REPORT TO CLINIC OR PRIMARY MD: Findings as discussed by T. Jacalyn Lefevre, PA-C.  Follow-up as discussed - you are scheduled for a CT and return visit to the clinic to discuss results.  I acknowledge that I have been informed and understand all the instructions given to me and received a copy. I do not have any more questions at this time, but understand that I may call the Specialty Clinic at [facility] at [phone] during business hours should I have any further questions or need assistance in obtaining follow-up care.    __________________________________________  _____________  __________ Signature of Patient or Authorized Representative            Date                   Time    __________________________________________ Nurse's Signature

## 2011-03-28 LAB — FOLATE: Folate: >20 ng/mL

## 2011-03-28 LAB — VITAMIN B12: Vitamin B-12: 896 pg/mL (ref 211–911)

## 2011-03-30 NOTE — Progress Notes (Signed)
Saw [facility] FEB 1

## 2011-04-01 ENCOUNTER — Ambulatory Visit (HOSPITAL_COMMUNITY)
Admission: RE | Admit: 2011-04-01 | Discharge: 2011-04-01 | Disposition: A | Discharge status: Home / Self Care | Payer: Medicare HMO | Source: Ambulatory Visit | Attending: Oncology | Admitting: Oncology

## 2011-04-01 ENCOUNTER — Other Ambulatory Visit (HOSPITAL_COMMUNITY): Payer: Self-pay | Admitting: Oncology

## 2011-04-01 ENCOUNTER — Encounter (HOSPITAL_COMMUNITY): Payer: Self-pay | Admitting: Oncology

## 2011-04-01 ENCOUNTER — Encounter (HOSPITAL_COMMUNITY): Payer: Self-pay

## 2011-04-01 DIAGNOSIS — F17200 Nicotine dependence, unspecified, uncomplicated: Secondary | ICD-10-CM

## 2011-04-01 DIAGNOSIS — D649 Anemia, unspecified: Secondary | ICD-10-CM

## 2011-04-01 DIAGNOSIS — D696 Thrombocytopenia, unspecified: Secondary | ICD-10-CM

## 2011-04-01 DIAGNOSIS — Z980 Intestinal bypass and anastomosis status: Secondary | ICD-10-CM | POA: Insufficient documentation

## 2011-04-01 DIAGNOSIS — D61818 Other pancytopenia: Secondary | ICD-10-CM | POA: Insufficient documentation

## 2011-04-01 DIAGNOSIS — R918 Other nonspecific abnormal finding of lung field: Secondary | ICD-10-CM

## 2011-04-01 DIAGNOSIS — D72819 Decreased white blood cell count, unspecified: Secondary | ICD-10-CM

## 2011-04-01 DIAGNOSIS — Q619 Cystic kidney disease, unspecified: Secondary | ICD-10-CM | POA: Insufficient documentation

## 2011-04-01 DIAGNOSIS — R911 Solitary pulmonary nodule: Secondary | ICD-10-CM | POA: Insufficient documentation

## 2011-04-01 DIAGNOSIS — N400 Enlarged prostate without lower urinary tract symptoms: Secondary | ICD-10-CM | POA: Insufficient documentation

## 2011-04-01 DIAGNOSIS — K8020 Calculus of gallbladder without cholecystitis without obstruction: Secondary | ICD-10-CM | POA: Insufficient documentation

## 2011-04-01 HISTORY — DX: Other nonspecific abnormal finding of lung field: R91.8

## 2011-04-01 MED ORDER — IOHEXOL 300 MG/ML  SOLN
100.0000 mL | Freq: Once | INTRAMUSCULAR | Status: AC | PRN
  Administered 2011-04-01: 100 mL via INTRAVENOUS

## 2011-04-01 NOTE — Progress Notes (Signed)
This office note has been dictated.

## 2011-04-02 ENCOUNTER — Telehealth: Payer: Self-pay

## 2011-04-02 MED ORDER — PANTOPRAZOLE SODIUM 40 MG PO TBEC: 40.0000 mg | DELAYED_RELEASE_TABLET | Freq: Every day | ORAL | Status: DC

## 2011-04-02 NOTE — Progress Notes (Signed)
ADDENDUM: I have reviewed his peripheral blood smear, which does not reveal any evidence for leukemia.  He does have some large platelets.  His platelets look mildly decreased, however.  White cells certainly look normal.  Red cells are also not remarkable at this time.  There may have been an occasional large red cell, and his white cells may be slightly lower in number, but there were no obvious diagnostic cells to give [place] a hint as to the cause of his pancytopenia.    ______________________________ Ladona Horns. Mariel Sleet, MD ESN/MEDQ  D:  04/01/2011  T:  04/02/2011  Job:  [id number]

## 2011-04-02 NOTE — Telephone Encounter (Signed)
Pt's wife called and would like a prescription of Protonix for pt sent to [facility]....the fax number is [phone].

## 2011-04-02 NOTE — Telephone Encounter (Signed)
Number listed in epic is little different. Let me know if you need anything else.

## 2011-04-03 MED ORDER — PANTOPRAZOLE SODIUM 40 MG PO TBEC: 40.0000 mg | DELAYED_RELEASE_TABLET | Freq: Every day | ORAL | Status: DC

## 2011-04-03 NOTE — Telephone Encounter (Signed)
Faxing the prescription to the number given.

## 2011-04-03 NOTE — Telephone Encounter (Signed)
Forwarding to Leslie Lewis, PA.  

## 2011-04-08 ENCOUNTER — Ambulatory Visit: Payer: Medicare HMO | Admitting: Gastroenterology

## 2011-04-13 ENCOUNTER — Encounter (HOSPITAL_BASED_OUTPATIENT_CLINIC_OR_DEPARTMENT_OTHER): Payer: Medicare HMO | Admitting: Oncology

## 2011-04-13 DIAGNOSIS — Z86718 Personal history of other venous thrombosis and embolism: Secondary | ICD-10-CM

## 2011-04-13 DIAGNOSIS — D61818 Other pancytopenia: Secondary | ICD-10-CM

## 2011-04-13 DIAGNOSIS — J449 Chronic obstructive pulmonary disease, unspecified: Secondary | ICD-10-CM

## 2011-04-13 NOTE — Progress Notes (Signed)
CC:   Mark Morrison. [name], M.D.  DIAGNOSES: 1. Pancytopenia. 2. Chronic obstructive pulmonary disease with, I think,a very small     amount of emphysema. 3. Small pulmonary nodules which need followup in 6 months. 4. Benign prostatic hypertrophy, status post recent surgical     intervention by Dr. Jerre Simon on February 03, 2011. 5. History of gastroesophageal reflux disease. 6. History of deep venous thrombosis and what sounds like a small     pulmonary embolism in the past. 7. History of ulcerative colitis, status post colectomy. 8. Clubbing of his fingers. He has quit smoking interestingly.  His CT scan of the chest and abdomen does not reveal hepatosplenomegaly.  He has nonspecific lymph nodes at the upper limits of normal basically in a few places.  He has these very small pulmonary nodules, which need followup in 6 to 12 months.  He had a PE in a postoperative setting years ago.  His pancytopenia, however, remains unexplained.  His white count the other day was 2600, hemoglobin 12.0 g, platelets 118,000.  Differential was unremarkable.  His peripheral blood smear was not remarkable for any distinct disorder.  His folic acid was greater than 20.  B12 was 896 and that is on B12 injections every 3 months and some oral B12.  So what I have recommended to him, his wife, and daughter who are with him today is that he undergo a bone marrow aspirate and biopsy for flow cytometry and cytogenetics as well.  I have gone over his CT scan today with Dr. Davonna Belling in Radiology and he clearly does not have splenomegaly or anything else to explain the pancytopenia.  He is willing to proceed with this.  We had a long discussion.  We went over the process of doing a bone marrow aspirate and biopsy, what we are going to send it for.  We are going to premedicate him with Xanax 0.5 mg an hour before, hydrocodone 5/325 an hour before.  He is not eat breakfast that morning and we will do this on a  Monday, as is the desire of his wife, who will be bringing him.  So they are in agreement.  I have shown him today where we do the bone marrow biopsy from on his posterior-superior iliac spinous process area and he is willing to proceed.    ______________________________ Ladona Horns. Mariel Sleet, MD ESN/MEDQ  D:  04/13/2011  T:  04/13/2011  Job:  [id number]

## 2011-04-13 NOTE — Patient Instructions (Addendum)
Mark Morrison  [id number] 28-Dec-1945   [facility]  Discharge Instructions  RECOMMENDATIONS MADE BY THE CONSULTANT AND ANY TEST RESULTS WILL BE SENT TO YOUR REFERRING DOCTOR.   EXAM FINDINGS BY MD TODAY AND SIGNS AND SYMPTOMS TO REPORT TO CLINIC OR PRIMARY MD: Need to do Bone Marrow Biopsy and Aspiration to find out what is going on.  MEDICATIONS PRESCRIBED:  Hydrocodone 5/325 mg Take 2 pills 1 hour prior to Bone marrow biopsy and aspiration on 04/27/11 (Take at 7:30am) then every 4 hours as needed for pain. Xanax 0.5mg  Take 1 pill 1 hour prior to Bone marrow biopsy and aspiration on 04/27/11 (Take at 7:30am)then every 6 hours as needed for anxiety Follow label directions  INSTRUCTIONS GIVEN AND DISCUSSED: Other :  Nothing to eat or drink the morning of the procedure  SPECIAL INSTRUCTIONS/FOLLOW-UP: Return to Clinic on 3/4 at 8:30am for Bone Marrow Biopsy and Aspiration and 3/22 at 10 am to discuss results.   I acknowledge that I have been informed and understand all the instructions given to me and received a copy. I do not have any more questions at this time, but understand that I may call the Specialty Clinic at [facility] at [phone] during business hours should I have any further questions or need assistance in obtaining follow-up care.    __________________________________________  _____________  __________ Signature of Patient or Authorized Representative            Date                   Time    __________________________________________ Nurse's Signature

## 2011-04-13 NOTE — Progress Notes (Signed)
ict

## 2011-04-22 ENCOUNTER — Ambulatory Visit (HOSPITAL_COMMUNITY): Payer: Medicare HMO | Admitting: Oncology

## 2011-04-27 ENCOUNTER — Encounter (HOSPITAL_COMMUNITY): Payer: Medicare HMO | Attending: Oncology | Admitting: Oncology

## 2011-04-27 DIAGNOSIS — D61818 Other pancytopenia: Secondary | ICD-10-CM

## 2011-04-27 DIAGNOSIS — M810 Age-related osteoporosis without current pathological fracture: Secondary | ICD-10-CM | POA: Insufficient documentation

## 2011-04-27 DIAGNOSIS — D696 Thrombocytopenia, unspecified: Secondary | ICD-10-CM

## 2011-04-27 DIAGNOSIS — D469 Myelodysplastic syndrome, unspecified: Secondary | ICD-10-CM | POA: Insufficient documentation

## 2011-04-27 LAB — CBC
Hemoglobin: 11.4 g/dL — ABNORMAL LOW (ref 13.0–17.0)
MCH: 35.1 pg — ABNORMAL HIGH (ref 26.0–34.0)
MCV: 100.9 fL — ABNORMAL HIGH (ref 78.0–100.0)
Platelets: 108 10*3/uL — ABNORMAL LOW (ref 150–400)
RBC: 3.25 MIL/uL — ABNORMAL LOW (ref 4.22–5.81)
WBC: 3.2 10*3/uL — ABNORMAL LOW (ref 4.0–10.5)

## 2011-04-27 LAB — DIFFERENTIAL
Basophils Absolute: 0 10*3/uL (ref 0.0–0.1)
Basophils Relative: 0 % (ref 0–1)
Eosinophils Absolute: 0 10*3/uL (ref 0.0–0.7)
Eosinophils Relative: 0 % (ref 0–5)
Lymphocytes Relative: 28 % (ref 12–46)
Lymphs Abs: 0.9 10*3/uL (ref 0.7–4.0)
Monocytes Relative: 29 % — ABNORMAL HIGH (ref 3–12)
Neutrophils Relative %: 43 % (ref 43–77)

## 2011-04-27 MED ORDER — LIDOCAINE HCL (PF) 2 % IJ SOLN
INTRAMUSCULAR | Status: AC
  Filled 2011-04-27: qty 2

## 2011-04-27 NOTE — Procedures (Signed)
DIAGNOSIS:  Pancytopenia.  PROCEDURE:  After informed consent was obtained, he was placed in the prone position.  His identity was verified by asking his name, date of birth, etc.  He knew what the procedure was that was going to be performed.  We identified the posterior superior iliac spinous processes and chose the right.  We did remove hair by shaving him with dry blade just in the area of the posterior superior iliac spinous process and then cleansed the skin with 3 Betadine swabs, and anesthetized him with 8-9 cc of 2% plain Xylocaine, and obtained a bone marrow aspirate and biopsy without incident.  I do think he has somewhat soft bones and should have a bone density at some point.    ______________________________ Ladona Horns. Mariel Sleet, MD ESN/MEDQ  D:  04/27/2011  T:  04/27/2011  Job:  [id number]

## 2011-04-27 NOTE — Progress Notes (Signed)
[facility] BONE MARROW BIOPSY/ASPIRATE PROGRESS NOTE  Mark Morrison presents for Bone Marrow biopsy per MD orders. Mark Morrison verbalized understanding of procedure. Consent reviewed and signed.  Mark Morrison positioned supine for procedure. Time-out performed and Bone Marrow Checklist. Procedure began at [phone]. Xylocaine 2% 10 cc used for local and administered to patient by DR. Neijstrom. Procedure completed at 0901. Patient tolerated well. Pressure dressing applied to the right hip with instructions to leave in place for 24 hours. Patient instructed to report any bleeding that saturates dressing and to take pain medication  as directed. Dressing dry and intact to the left hip on discharge.

## 2011-04-27 NOTE — Patient Instructions (Signed)
[facility]  Discharge Instructions  RECOMMENDATIONS MADE BY THE CONSULTANT AND ANY TEST RESULTS WILL BE SENT TO YOUR REFERRING DOCTOR.   EXAM FINDINGS BY MD TODAY AND SIGNS AND SYMPTOMS TO REPORT TO CLINIC OR PRIMARY MD: Bone marrow biopsy today. Keep the dressing dry for 24 hrs and then you may take it off and place a bandaid on it.  MEDICATIONS PRESCRIBED:  You can use pain meds as needed today.}  INSTRUCTIONS GIVEN AND DISCUSSED  :watch area for bleeding. You may reinforce dressing if bleeding occurs. Other ---if you run a fever or have drainage from the area on your hip come back for [place] to take a look at it.  SPECIAL INSTRUCTIONS/FOLLOW-UP: Return as scheduled. March 22 at 10 am.   I acknowledge that I have been informed and understand all the instructions given to me and received a copy. I do not have any more questions at this time, but understand that I may call the Specialty Clinic at [facility] at [phone] during business hours should I have any further questions or need assistance in obtaining follow-up care.    __________________________________________  _____________  __________ Signature of Patient or Authorized Representative            Date                   Time    __________________________________________ Nurse's Signature

## 2011-04-27 NOTE — Progress Notes (Signed)
This office note has been dictated.

## 2011-05-04 ENCOUNTER — Other Ambulatory Visit (HOSPITAL_COMMUNITY): Payer: Medicare HMO

## 2011-05-12 ENCOUNTER — Ambulatory Visit (HOSPITAL_COMMUNITY): Payer: Medicare HMO | Admitting: Oncology

## 2011-05-15 ENCOUNTER — Encounter (HOSPITAL_BASED_OUTPATIENT_CLINIC_OR_DEPARTMENT_OTHER): Payer: Medicare HMO | Admitting: Oncology

## 2011-05-15 VITALS — BP 108/71 | HR 83 | Temp 98.4°F | Wt 171.4 lb

## 2011-05-15 DIAGNOSIS — Z86718 Personal history of other venous thrombosis and embolism: Secondary | ICD-10-CM

## 2011-05-15 DIAGNOSIS — D469 Myelodysplastic syndrome, unspecified: Secondary | ICD-10-CM

## 2011-05-15 DIAGNOSIS — D61818 Other pancytopenia: Secondary | ICD-10-CM

## 2011-05-15 NOTE — Progress Notes (Signed)
This office note has been dictated.

## 2011-05-15 NOTE — Patient Instructions (Signed)
Mark Morrison  [id number] January 13, 1946   [facility]  Discharge Instructions  RECOMMENDATIONS MADE BY THE CONSULTANT AND ANY TEST RESULTS WILL BE SENT TO YOUR REFERRING DOCTOR.   EXAM FINDINGS BY MD TODAY AND SIGNS AND SYMPTOMS TO REPORT TO CLINIC OR PRIMARY MD: You have Myelodysplastic Syndrome.  There is a study available at [facility] and if you want to participate let us know and we will make a consult.  MEDICATIONS PRESCRIBED: Ferrous Sulfate 325 mg daily for 6 months.   INSTRUCTIONS GIVEN AND DISCUSSED: Other :Report unusual bruising, bleeding, etc.  SPECIAL INSTRUCTIONS/FOLLOW-UP: Lab work Needed every 8 weeks, Xray Studies Needed CT of chest in August and Return to Clinic to see the PA after Scans in August.   I acknowledge that I have been informed and understand all the instructions given to me and received a copy. I do not have any more questions at this time, but understand that I may call the Specialty Clinic at [facility] at [phone] during business hours should I have any further questions or need assistance in obtaining follow-up care.    __________________________________________  _____________  __________ Signature of Patient or Authorized Representative            Date                   Time    __________________________________________ Nurse's Signature

## 2011-05-15 NOTE — Progress Notes (Signed)
CC:   Mark Morrison. Mark Morrison, M.D. Mark Morrison, M.D.  DIAGNOSES: 1. 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 2. Chronic obstructive pulmonary disease. 3. Small pulmonary nodules which need a CT scan again in August and     that is scheduled. 4. Benign prostatic hyperplasia status post surgical intervention by     Dr. Jerre Simon 02/03/2011. 5. History of gastroesophageal reflux disease. 6. History of ulcerative colitis status post colectomy. 7. 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 8. History of deep venous thrombosis and a pulmonary embolus in the     past. Mark Morrison is here today with his wife to go over the results of the bone marrow biopsy and aspiration, including flow cytometry and cytogenetics. He did not have a definitive increase in his blasts on the flow cytometry but it was felt to have mild increase on the routine H and E by Dr. Laureen Ochs.  His monocyte count is around 1000, between 900 and 1100 over the last 4-6 months realistically.  His cytogenetics, however, were normal.  So I went over with him and his wife the diagnosis of myelodysplastic syndrome, and the fact that he needs to be watched or he could be considered for a protocol utilizing chemotherapy and bone marrow transplant that is a national study that is being done at [facility], and I will be happy to send him for a consultation.  He does not want to pursue that at all.  I told him that the initial visit would just be a consultation to talk about things, if he so desires, so he could change his mind if he likes.  His bone marrow did show decreased iron stores, so I have asked him to take 1 ferrous sulfate 325 mg a day for 6 months.  I suspect he lost iron over the years from his ulcerative colitis issues.  I think we have the CT scan already scheduled for August and that has been verified.  He will come back  every 8 weeks for CBC and diff.  We will see him in 16 weeks and 32 weeks in followup, and if he does want a second opinion we are happy to provide that, no matter where he wants to go.  I told he and his wife that I would be happy to talk to his daughter if she wants to call me since this is a very confusing issue to try to explain to patients.  What I essentially said was his marrow is ill, we are not sure why it I sick, but that it may get worse and sometimes does progress to acute leukemia which is always a big concern. Otherwise we can just watch him and hope that he does well for quite some time.  He indicated once again he does not want to do anything interventional at this juncture.    ______________________________ Ladona Horns. Mariel Sleet, MD ESN/MEDQ  D:  05/15/2011  T:  05/15/2011  Job:  [id number]

## 2011-05-21 ENCOUNTER — Ambulatory Visit (INDEPENDENT_AMBULATORY_CARE_PROVIDER_SITE_OTHER): Payer: Medicare HMO | Admitting: Urgent Care

## 2011-05-21 ENCOUNTER — Ambulatory Visit: Payer: Medicare HMO | Admitting: Gastroenterology

## 2011-05-21 ENCOUNTER — Encounter: Payer: Self-pay | Admitting: Urgent Care

## 2011-05-21 VITALS — BP 107/67 | HR 90 | Temp 98.2°F | Ht 67.0 in | Wt 169.0 lb

## 2011-05-21 DIAGNOSIS — K5190 Ulcerative colitis, unspecified, without complications: Secondary | ICD-10-CM

## 2011-05-21 DIAGNOSIS — K91850 Pouchitis: Secondary | ICD-10-CM

## 2011-05-21 DIAGNOSIS — R197 Diarrhea, unspecified: Secondary | ICD-10-CM

## 2011-05-21 DIAGNOSIS — K2270 Barrett's esophagus without dysplasia: Secondary | ICD-10-CM

## 2011-05-21 DIAGNOSIS — D61818 Other pancytopenia: Secondary | ICD-10-CM

## 2011-05-21 MED ORDER — CIPROFLOXACIN HCL 250 MG PO TABS: 250.0000 mg | ORAL_TABLET | Freq: Every day | ORAL | Status: DC

## 2011-05-21 NOTE — Assessment & Plan Note (Signed)
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  Cipro 250 mg daily for 10 days Align probiotic daily Call if no relief from diarrhea.

## 2011-05-21 NOTE — Progress Notes (Signed)
Primary Care Physician:  Londell Moh, MD, MD Primary Gastroenterologist:  Dr. Darrick Penna  Chief Complaint  Patient presents with  . Diarrhea    HPI:  Mark Morrison is a 67 y.o. male here for followup pouchitis and to set up EGD for surveillance of his Barrett esophagus. He has history of ulcerative colitis status post proctocolectomy with ileal anastomosis and anal pouch creation and history of recurrent pouchitis. He continues to complain of diarrhea with multiple loose stools daily.  He tells me after dinner he has loose stools every few minutes to an hour until 11pm when he goes to bed.  Denies blood in stool.  He is requesting antibiotics.  Denies abdominal pain.  Denies dysphagia or odynophagia.  Appetite ok.  Weight stable.  He felt like last antibiotics given Decemeber not much help.  He was treated with Flagyl for 10 days.  He had previously failed treatment with mesalamine. He has also been taking align probiotics daily.  Denies any upper GI symptoms including heartburn, indigestion, nausea, vomiting, dysphagia, or odynophagia. January 2013 stool studies were normal including Giardia, C. difficile, and stool culture.  He is seeing Dr. 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  He tells me he is going to be referred to [facility] for further workup and possible management of this.  Lab Summary Latest Ref Rng 04/27/2011 03/27/2011 02/04/2011  Hemoglobin 13.0 - 17.0 g/dL 16.1 (L) 09.6 (L) 04.5 (L)  Hematocrit 39.0 - 52.0 % 32.8 (L) 35.1 (L) 32.4 (L)  White count 4.0 - 10.5 K/uL 3.2 (L) 2.6 (L) 4.0  Platelet count 150 - 400 K/uL 108 (L) 118 (L) 98 (L)  Sodium 135 - 145 mEq/L (None) 136 133 (L)  Potassium 3.5 - 5.1 mEq/L (None) 3.9 4.1  Calcium 8.4 - 10.5 mg/dL (None) 9.7 8.5  Phosphorus  (None) (None) (None)  Creatinine 0.50 - 1.35 mg/dL (None) 4.09 8.11  AST 0 - 37 U/L (None) 15 (None)  Alk Phos 39 - 117 U/L (None) 57 (None)  Bilirubin 0.3 - 1.2 mg/dL (None) 0.6  (None)  Glucose 70 - 99 mg/dL (None) 92 914 (H)  Cholesterol  (None) (None) (None)  HDL cholesterol  (None) (None) (None)  Triglycerides  (None) (None) (None)  LDL Direct  (None) (None) (None)  LDL Calc  (None) (None) (None)  Total protein 6.0 - 8.3 g/dL (None) 8.3 (None)  Albumin 3.5 - 5.2 g/dL (None) 3.9 (None)    Past Medical History  Diagnosis Date  . Ulcerative colitis 2004    with IPPA  . 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 History of DVT (deep vein thrombosis)     and PE  . Barrett's esophagus 02/2010    on EGD  . Vitamin B 12 deficiency   . BPH (benign prostatic hyperplasia)   . GERD (gastroesophageal reflux disease)   . Pulmonary nodules 04/01/2011    Past Surgical History  Procedure Date  . Knee surgery     Left  . Abdominoperineal proctocolectomy 2004    with ileal anastomosis and anal pouch creation at [facility]  . Colonoscopy 11/22/2006    ULCERATIVE COLITIS  . Transurethral resection of prostate 02/03/2011    Procedure: TRANSURETHRAL RESECTION OF THE PROSTATE (TURP);  Surgeon: Ky Barban;  Location: [facility];  Service: Urology;  Laterality: N/A;  . Ileoscopy 03/14/2010    geographic ulceratios of the ileal pouch c/w pouchitis, more proximal distal ileum mucosa normal, biospy from ulcer benign  .  Esophagogastroduodenoscopy 03/14/10    barret's esophagus, small hh, erosive duodenitis  . Colectomy     Current Outpatient Prescriptions  Medication Sig Dispense Refill  . Cyanocobalamin (VITAMIN B-12 IJ) Inject as directed every 3 (three) months.        . ferrous sulfate 325 (65 FE) MG tablet Take 325 mg by mouth daily with breakfast.      . fish oil-omega-3 fatty acids 1000 MG capsule Take 2 g by mouth daily. Doesn't take everyday.      . Multiple Vitamin (MULTIVITAMIN) capsule Take 1 capsule by mouth daily.        . pantoprazole (PROTONIX) 40 MG tablet Take 1 tablet (40 mg total) by mouth daily.  90 tablet  3  . vitamin B-12 (CYANOCOBALAMIN) 1000 MCG  tablet Take 1,000 mcg by mouth daily.        . vitamin C (ASCORBIC ACID) 500 MG tablet Take 500 mg by mouth daily.        . ciprofloxacin (CIPRO) 250 MG tablet Take 1 tablet (250 mg total) by mouth daily.  10 tablet  0  . Probiotic Product (ALIGN) 4 MG CAPS Take 4 mg by mouth daily.  30 capsule  0    Allergies as of 05/21/2011  . (No Known Allergies)    Family History:  Problem Relation Age of Onset  . Anesthesia problems Neg Hx   . Hypotension Neg Hx   . Malignant hyperthermia Neg Hx   . Pseudochol deficiency Neg Hx     History   Social History  . Marital Status: Married    Spouse Name: N/A    Number of Children: 2  . Years of Education: N/A   Occupational History  . retired, Therapist, sports, Psychologist, occupational    Social History Main Topics  . Smoking status: Former Smoker -- 1.0 packs/day for 50 years    Types: Cigarettes    Quit date: 03/23/2011  . Smokeless tobacco: Former Neurosurgeon    Quit date: 04/12/2011  . Alcohol Use: No  . Drug Use: No  . Sexually Active: Not Currently  Reiew of Systems: Gen: Denies any fever, chills, sweats, anorexia, fatigue, weakness, malaise, weight loss, and sleep disorder CV: Denies chest pain, angina, palpitations, syncope, orthopnea, PND, peripheral edema, and claudication. Resp: Denies dyspnea at rest, dyspnea with exercise, cough, sputum, wheezing, coughing up blood, and pleurisy. GI: See history of present illness  GU : Recent problems with urinary flow, recent TURP, symptoms much improved. MS: Denies joint pain, limitation of movement, and swelling, stiffness, low back pain, extremity pain. Denies muscle weakness, cramps, atrophy.  Derm: Denies rash, itching, dry skin, hives, moles, warts, or unhealing ulcers.  Psych: Denies depression, anxiety, memory loss, suicidal ideation, hallucinations, paranoia, and confusion. Heme: Denies bruising, bleeding, and enlarged lymph nodes. Neuro:  Denies any headaches, dizziness, paresthesias. Endo:  Denies any  problems with DM, thyroid, adrenal function.  Physical Exam: BP 107/67  Pulse 90  Temp(Src) 98.2 F (36.8 C) (Temporal)  Ht 5\' 7"  (1.702 m)  Wt 169 lb (76.658 kg)  BMI 26.47 kg/m2 General:   Alert,  Well-developed, well-nourished, pleasant and cooperative in NAD Head:  Normocephalic and atraumatic. Eyes:  Sclera clear, no icterus.   Conjunctiva pink. Ears:  Normal auditory acuity. Nose:  No deformity, discharge, or lesions. Mouth:  No deformity or lesions,oropharynx pink & moist. Neck:  Supple; no masses or thyromegaly. Lungs:  Chronic emphysematous changes. No significant wheezing or rales. No acute distress. Heart:  Regular  rate and rhythm; no murmurs, clicks, rubs,  or gallops. Abdomen:  Multiple well-healed old adominal scars. Normal bowel sounds.  No bruits.  Soft, non-tender and non-distended without masses, hepatosplenomegaly or hernias noted.  No guarding or rebound tenderness.   Rectal:  Deferred. Msk:  Symmetrical without gross deformities. Normal posture. Pulses:  Normal pulses noted. Extremities:  Positive for clubbing. No edema. Neurologic:  Alert and  oriented x4;  grossly normal neurologically. Skin:  Intact without significant lesions or rashes. Lymph Nodes:  No significant cervical adenopathy. Psych:  Alert and cooperative. Normal mood and affect.

## 2011-05-21 NOTE — Assessment & Plan Note (Signed)
Persistent. See pouchitis.

## 2011-05-21 NOTE — Patient Instructions (Signed)
Begin Cipro 250 mg daily for 10 days Call if no relief from your diarrhea Continue align every day You will need an EGD with Dr. Darrick Penna to followup on your Barrett's esophagus

## 2011-05-21 NOTE — Progress Notes (Signed)
Faxed to PCP

## 2011-05-21 NOTE — Assessment & Plan Note (Signed)
Mark Morrison is a pleasant 66 y.o. male with history of Barrett's esophagus. He is due for surveillance. EGD with Dr. Darrick Penna.I have discussed risks & benefits which include, but are not limited to, bleeding, infection, perforation & drug reaction.  The patient agrees with this plan & written consent will be obtained.    Continued pantoprazole 40 mg daily.

## 2011-05-22 ENCOUNTER — Encounter (HOSPITAL_COMMUNITY): Payer: Self-pay

## 2011-05-22 NOTE — Progress Notes (Signed)
REVIEWED.  

## 2011-05-29 ENCOUNTER — Encounter (HOSPITAL_COMMUNITY)
Admission: RE | Disposition: A | Discharge status: Home / Self Care | Payer: Self-pay | Source: Ambulatory Visit | Attending: Gastroenterology

## 2011-05-29 ENCOUNTER — Ambulatory Visit (HOSPITAL_COMMUNITY)
Admission: RE | Admit: 2011-05-29 | Discharge: 2011-05-29 | Disposition: A | Discharge status: Home / Self Care | Payer: Medicare HMO | Source: Ambulatory Visit | Attending: Gastroenterology | Admitting: Gastroenterology

## 2011-05-29 ENCOUNTER — Encounter (HOSPITAL_COMMUNITY): Payer: Self-pay | Admitting: *Deleted

## 2011-05-29 DIAGNOSIS — K2980 Duodenitis without bleeding: Secondary | ICD-10-CM | POA: Insufficient documentation

## 2011-05-29 DIAGNOSIS — K2990 Gastroduodenitis, unspecified, without bleeding: Secondary | ICD-10-CM

## 2011-05-29 DIAGNOSIS — K2970 Gastritis, unspecified, without bleeding: Secondary | ICD-10-CM

## 2011-05-29 DIAGNOSIS — K2270 Barrett's esophagus without dysplasia: Secondary | ICD-10-CM

## 2011-05-29 DIAGNOSIS — K2940 Chronic atrophic gastritis without bleeding: Secondary | ICD-10-CM | POA: Insufficient documentation

## 2011-05-29 DIAGNOSIS — K449 Diaphragmatic hernia without obstruction or gangrene: Secondary | ICD-10-CM | POA: Insufficient documentation

## 2011-05-29 HISTORY — DX: Disease of blood and blood-forming organs, unspecified: D75.9

## 2011-05-29 HISTORY — PX: ESOPHAGOGASTRODUODENOSCOPY: SHX5428

## 2011-05-29 HISTORY — DX: Chronic obstructive pulmonary disease, unspecified: J44.9

## 2011-05-29 SURGERY — EGD (ESOPHAGOGASTRODUODENOSCOPY)
Anesthesia: Moderate Sedation

## 2011-05-29 MED ORDER — STERILE WATER FOR IRRIGATION IR SOLN
Status: DC | PRN
  Administered 2011-05-29: 14:00:00

## 2011-05-29 MED ORDER — SODIUM CHLORIDE 0.45 % IV SOLN
Freq: Once | INTRAVENOUS | Status: AC
  Administered 2011-05-29: 13:00:00 via INTRAVENOUS

## 2011-05-29 MED ORDER — MIDAZOLAM HCL 5 MG/5ML IJ SOLN
INTRAMUSCULAR | Status: DC | PRN
  Administered 2011-05-29: 1 mg via INTRAVENOUS
  Administered 2011-05-29: 2 mg via INTRAVENOUS
  Administered 2011-05-29: 1 mg via INTRAVENOUS
  Administered 2011-05-29: 2 mg via INTRAVENOUS

## 2011-05-29 MED ORDER — CIPROFLOXACIN HCL 500 MG PO TABS: ORAL_TABLET | ORAL | Status: AC

## 2011-05-29 MED ORDER — BUTAMBEN-TETRACAINE-BENZOCAINE 2-2-14 % EX AERO
INHALATION_SPRAY | CUTANEOUS | Status: DC | PRN
  Administered 2011-05-29: 2 via TOPICAL

## 2011-05-29 MED ORDER — MIDAZOLAM HCL 5 MG/5ML IJ SOLN
INTRAMUSCULAR | Status: AC
  Filled 2011-05-29: qty 10

## 2011-05-29 MED ORDER — MEPERIDINE HCL 100 MG/ML IJ SOLN
INTRAMUSCULAR | Status: AC
  Filled 2011-05-29: qty 1

## 2011-05-29 MED ORDER — MEPERIDINE HCL 100 MG/ML IJ SOLN
INTRAMUSCULAR | Status: DC | PRN
  Administered 2011-05-29: 25 mg via INTRAVENOUS
  Administered 2011-05-29: 50 mg via INTRAVENOUS
  Administered 2011-05-29: 25 mg via INTRAVENOUS

## 2011-05-29 MED ORDER — METRONIDAZOLE 500 MG PO TABS: ORAL_TABLET | ORAL | Status: AC

## 2011-05-29 NOTE — Interval H&P Note (Signed)
History and Physical Interval Note:  05/29/2011 1:42 PM  Mark Morrison  has presented today for surgery, with the diagnosis of BARRETTS ESOPHAGUS  The various methods of treatment have been discussed with the patient and family. After consideration of risks, benefits and other options for treatment, the patient has consented to  Procedure(s) (LRB): ESOPHAGOGASTRODUODENOSCOPY (EGD) (N/A) as a surgical intervention .  The patients' history has been reviewed, patient examined, no change in status, stable for surgery.  I have reviewed the patients' chart and labs.  Questions were answered to the patient's satisfaction.     [employer]

## 2011-05-29 NOTE — Discharge Instructions (Signed)
You have gastritis. I biopsied your stomach. YOU HAVE BARRETT'S. I BIOPSIED YOUR ESOPHAGUS.  CONTINUE PROTOIX 30 MINUTES PRIOR TO YOUR FIRST MEAL EVERY MORNING FOREVER.  TAKE CIPRO AND FLAGYL TWICE DAILY FOR YOUR POUCHITIS/DIARRHEA.  YOUR BIOPSIES WILL BE BACK IN 7-10 DAYS.  FOLLOW UP IN 3-4 WEEKS WITH DR. Tatelyn Morrison.    UPPER ENDOSCOPY AFTER CARE Read the instructions outlined below and refer to this sheet in the next week. These discharge instructions provide you with general information on caring for yourself after you leave the hospital. While your treatment has been planned according to the most current medical practices available, unavoidable complications occasionally occur. If you have any problems or questions after discharge, call DR. Jumana Paccione, [phone].  ACTIVITY  You may resume your regular activity, but move at a slower pace for the next 24 hours.   Take frequent rest periods for the next 24 hours.   Walking will help get rid of the air and reduce the bloated feeling in your belly (abdomen).   No driving for 24 hours (because of the medicine (anesthesia) used during the test).   You may shower.   Do not sign any important legal documents or operate any machinery for 24 hours (because of the anesthesia used during the test).    NUTRITION  Drink plenty of fluids.   You may resume your normal diet as instructed by your doctor.   Begin with a light meal and progress to your normal diet. Heavy or fried foods are harder to digest and may make you feel sick to your stomach (nauseated).   Avoid alcoholic beverages for 24 hours or as instructed.    MEDICATIONS  You may resume your normal medications.   WHAT YOU CAN EXPECT TODAY  Some feelings of bloating in the abdomen.   Passage of more gas than usual.    IF YOU HAD A BIOPSY TAKEN DURING THE UPPER ENDOSCOPY:  Eat a soft diet IF YOU HAVE NAUSEA, BLOATING, ABDOMINAL PAIN, OR VOMITING.    FINDING OUT THE  RESULTS OF YOUR TEST Not all test results are available during your visit. DR. Darrick Penna WILL CALL YOU WITHIN 7 DAYS OF YOUR PROCEDUE WITH YOUR RESULTS. Do not assume everything is normal if you have not heard from DR. Pricsilla Lindvall IN ONE WEEK, CALL HER OFFICE AT [phone].  SEEK IMMEDIATE MEDICAL ATTENTION AND CALL THE OFFICE: [phone] IF:  You have more than a spotting of blood in your stool.   Your belly is swollen (abdominal distention).   You are nauseated or vomiting.   You have a temperature over 101F.   You have abdominal pain or discomfort that is severe or gets worse throughout the day.   Gastritis  Gastritis is an inflammation (the body's way of reacting to injury and/or infection) of the stomach. It is often caused by viral or bacterial (germ) infections. It can also be caused BY ASPIRIN, BC/GOODY POWDER'S, (IBUPROFEN) MOTRIN, OR ALEVE (NAPROXEN), chemicals (including alcohol), SPICY FOODS, and medications. This illness may be associated with generalized malaise (feeling tired, not well), UPPER ABDOMINAL STOMACH cramps, and fever. One common bacterial cause of gastritis is an organism known as H. Pylori. This can be treated with antibiotics.     Barrett's Esophagus The esophagus is the muscular tube that carries food and saliva from the mouth to the stomach. Barrett's esophagus involves changes in the esophagus. Some of its lining is replaced by a type of tissue similar to that found in the intestine. This  process is called intestinal metaplasia.   While Barrett's esophagus may cause no symptoms itself, a small number of people with this condition develop a relatively rare but often deadly type of cancer of the esophagus. It is called esophageal adenocarcinoma. Barrett's esophagus is associated with the common condition called GERD (gastroesophageal reflux disease).  GERD (GASTROESOPHAGEAL REFLUX DISEASE) Having some stomach contents (liquids or gas) sometimes reflux (come back  up from the stomach into the esophagus) is considered normal. When it happens often, and causes other symptoms, it is considered a medical problem or disease. However, it is not necessarily a serious one or one that requires seeing a caregiver. The stomach produces acid and enzymes to digest food. When this mixture refluxes into the esophagus more often than normal or for a longer period of time than normal, it may produce symptoms. These symptoms are often called acid reflux. They are often described by people as heartburn, indigestion, or "gas". The symptoms often consist of a burning sensation below and behind the lower part of the breastbone or sternum. Almost everyone has experienced these symptoms at least once. This is typically a result of overeating. Other things that provoke GERD symptoms include:  Being overweight.   Eating certain types of foods.   Being pregnant.  In most people, GERD symptoms may last only a short time and require no treatment at all.  More continual symptoms are often quickly relieved by over-the-counter acid-reducing agents, such as antacids.  Overall, GERD is one of the most common medical conditions. About 20 percent of the population can be affected over a lifetime.  GERD AND BARRETT'S ESOPHAGUS The exact causes of Barrett's esophagus are not known. It is thought to be caused in part by the same factors that cause GERD. People who do not have heartburn can have Barrett's esophagus. However, it is found about 3 to 5 times more often in people with this condition. Barrett's esophagus is uncommon in children. The average age at diagnosis is 69. But it is usually difficult to know when the problem started. It is about twice as common in men as in women. It is much more common in white men than in men of other races.  IMPORTANT POINTS TO REMEMBER  In Barrett's esophagus, the cells lining the esophagus change. They become similar to the cells lining the intestine.    Barrett's esophagus is connected with gastroesophageal reflux disease or GERD.   A small number of people with Barrett's esophagus may develop esophageal cancer.   Barrett's esophagus is diagnosed by upper gastrointestinal endoscopy and biopsy.   People who have Barrett's esophagus should have periodic esophageal exams.   Taking acid-blocking drugs for GERD may help improve Barrett's esophagus.   HOME CARE INSTRUCTIONS Eat 2-3 hours before going to bed.  Try to reach and maintain a healthy weight.  Do not eat just a few very large meals. Instead, eat 4 TO 6 smaller meals throughout the day.  Try to identify foods and beverages that make your reflux worse, and avoid these.  Avoid tight clothing.  Do not exercise right after eating.

## 2011-05-29 NOTE — H&P (View-Only) (Signed)
Primary Care Physician:  Londell Moh, MD, MD Primary Gastroenterologist:  Dr. Darrick Penna  Chief Complaint  Patient presents with  . Diarrhea    HPI:  Mark Morrison is a 66 y.o. male here for followup pouchitis and to set up EGD for surveillance of his Barrett esophagus. He has history of ulcerative colitis status post proctocolectomy with ileal anastomosis and anal pouch creation and history of recurrent pouchitis. He continues to complain of diarrhea with multiple loose stools daily.  He tells me after dinner he has loose stools every few minutes to an hour until 11pm when he goes to bed.  Denies blood in stool.  He is requesting antibiotics.  Denies abdominal pain.  Denies dysphagia or odynophagia.  Appetite ok.  Weight stable.  He felt like last antibiotics given Decemeber not much help.  He was treated with Flagyl for 10 days.  He had previously failed treatment with mesalamine. He has also been taking align probiotics daily.  Denies any upper GI symptoms including heartburn, indigestion, nausea, vomiting, dysphagia, or odynophagia. January 2013 stool studies were normal including Giardia, C. difficile, and stool culture.  He is seeing Dr. 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  He tells me he is going to be referred to [facility] for further workup and possible management of this.  Lab Summary Latest Ref Rng 04/27/2011 03/27/2011 02/04/2011  Hemoglobin 13.0 - 17.0 g/dL 78.2 (L) 95.6 (L) 21.3 (L)  Hematocrit 39.0 - 52.0 % 32.8 (L) 35.1 (L) 32.4 (L)  White count 4.0 - 10.5 K/uL 3.2 (L) 2.6 (L) 4.0  Platelet count 150 - 400 K/uL 108 (L) 118 (L) 98 (L)  Sodium 135 - 145 mEq/L (None) 136 133 (L)  Potassium 3.5 - 5.1 mEq/L (None) 3.9 4.1  Calcium 8.4 - 10.5 mg/dL (None) 9.7 8.5  Phosphorus  (None) (None) (None)  Creatinine 0.50 - 1.35 mg/dL (None) 0.86 5.78  AST 0 - 37 U/L (None) 15 (None)  Alk Phos 39 - 117 U/L (None) 57 (None)  Bilirubin 0.3 - 1.2 mg/dL (None) 0.6  (None)  Glucose 70 - 99 mg/dL (None) 92 469 (H)  Cholesterol  (None) (None) (None)  HDL cholesterol  (None) (None) (None)  Triglycerides  (None) (None) (None)  LDL Direct  (None) (None) (None)  LDL Calc  (None) (None) (None)  Total protein 6.0 - 8.3 g/dL (None) 8.3 (None)  Albumin 3.5 - 5.2 g/dL (None) 3.9 (None)    Past Medical History  Diagnosis Date  . Ulcerative colitis 2004    with IPPA  . 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 History of DVT (deep vein thrombosis)     and PE  . Barrett's esophagus 02/2010    on EGD  . Vitamin B 12 deficiency   . BPH (benign prostatic hyperplasia)   . GERD (gastroesophageal reflux disease)   . Pulmonary nodules 04/01/2011    Past Surgical History  Procedure Date  . Knee surgery     Left  . Abdominoperineal proctocolectomy 2004    with ileal anastomosis and anal pouch creation at [facility]  . Colonoscopy 11/22/2006    ULCERATIVE COLITIS  . Transurethral resection of prostate 02/03/2011    Procedure: TRANSURETHRAL RESECTION OF THE PROSTATE (TURP);  Surgeon: Ky Barban;  Location: [facility];  Service: Urology;  Laterality: N/A;  . Ileoscopy 03/14/2010    geographic ulceratios of the ileal pouch c/w pouchitis, more proximal distal ileum mucosa normal, biospy from ulcer benign  .  Esophagogastroduodenoscopy 03/14/10    barret's esophagus, small hh, erosive duodenitis  . Colectomy     Current Outpatient Prescriptions  Medication Sig Dispense Refill  . Cyanocobalamin (VITAMIN B-12 IJ) Inject as directed every 3 (three) months.        . ferrous sulfate 325 (65 FE) MG tablet Take 325 mg by mouth daily with breakfast.      . fish oil-omega-3 fatty acids 1000 MG capsule Take 2 g by mouth daily. Doesn't take everyday.      . Multiple Vitamin (MULTIVITAMIN) capsule Take 1 capsule by mouth daily.        . pantoprazole (PROTONIX) 40 MG tablet Take 1 tablet (40 mg total) by mouth daily.  90 tablet  3  . vitamin B-12 (CYANOCOBALAMIN) 1000 MCG  tablet Take 1,000 mcg by mouth daily.        . vitamin C (ASCORBIC ACID) 500 MG tablet Take 500 mg by mouth daily.        . ciprofloxacin (CIPRO) 250 MG tablet Take 1 tablet (250 mg total) by mouth daily.  10 tablet  0  . Probiotic Product (ALIGN) 4 MG CAPS Take 4 mg by mouth daily.  30 capsule  0    Allergies as of 05/21/2011  . (No Known Allergies)    Family History:  Problem Relation Age of Onset  . Anesthesia problems Neg Hx   . Hypotension Neg Hx   . Malignant hyperthermia Neg Hx   . Pseudochol deficiency Neg Hx     History   Social History  . Marital Status: Married    Spouse Name: N/A    Number of Children: 2  . Years of Education: N/A   Occupational History  . retired, Therapist, sports, Psychologist, occupational    Social History Main Topics  . Smoking status: Former Smoker -- 1.0 packs/day for 50 years    Types: Cigarettes    Quit date: 03/23/2011  . Smokeless tobacco: Former Neurosurgeon    Quit date: 04/12/2011  . Alcohol Use: No  . Drug Use: No  . Sexually Active: Not Currently  Reiew of Systems: Gen: Denies any fever, chills, sweats, anorexia, fatigue, weakness, malaise, weight loss, and sleep disorder CV: Denies chest pain, angina, palpitations, syncope, orthopnea, PND, peripheral edema, and claudication. Resp: Denies dyspnea at rest, dyspnea with exercise, cough, sputum, wheezing, coughing up blood, and pleurisy. GI: See history of present illness  GU : Recent problems with urinary flow, recent TURP, symptoms much improved. MS: Denies joint pain, limitation of movement, and swelling, stiffness, low back pain, extremity pain. Denies muscle weakness, cramps, atrophy.  Derm: Denies rash, itching, dry skin, hives, moles, warts, or unhealing ulcers.  Psych: Denies depression, anxiety, memory loss, suicidal ideation, hallucinations, paranoia, and confusion. Heme: Denies bruising, bleeding, and enlarged lymph nodes. Neuro:  Denies any headaches, dizziness, paresthesias. Endo:  Denies any  problems with DM, thyroid, adrenal function.  Physical Exam: BP 107/67  Pulse 90  Temp(Src) 98.2 F (36.8 C) (Temporal)  Ht 5\' 7"  (1.702 m)  Wt 169 lb (76.658 kg)  BMI 26.47 kg/m2 General:   Alert,  Well-developed, well-nourished, pleasant and cooperative in NAD Head:  Normocephalic and atraumatic. Eyes:  Sclera clear, no icterus.   Conjunctiva pink. Ears:  Normal auditory acuity. Nose:  No deformity, discharge, or lesions. Mouth:  No deformity or lesions,oropharynx pink & moist. Neck:  Supple; no masses or thyromegaly. Lungs:  Chronic emphysematous changes. No significant wheezing or rales. No acute distress. Heart:  Regular  rate and rhythm; no murmurs, clicks, rubs,  or gallops. Abdomen:  Multiple well-healed old adominal scars. Normal bowel sounds.  No bruits.  Soft, non-tender and non-distended without masses, hepatosplenomegaly or hernias noted.  No guarding or rebound tenderness.   Rectal:  Deferred. Msk:  Symmetrical without gross deformities. Normal posture. Pulses:  Normal pulses noted. Extremities:  Positive for clubbing. No edema. Neurologic:  Alert and  oriented x4;  grossly normal neurologically. Skin:  Intact without significant lesions or rashes. Lymph Nodes:  No significant cervical adenopathy. Psych:  Alert and cooperative. Normal mood and affect.

## 2011-05-29 NOTE — Op Note (Signed)
[facility] [address] [place], [place]  [address]  ENDOSCOPY PROCEDURE REPORT  PATIENT:  Mark Morrison, Mark Morrison  MR#:  [id number] BIRTHDATE:  13-Dec-1945, 66 yrs. old  GENDER:  male  ENDOSCOPIST:  Jonette Eva, MD Referred by:  Soyla Murphy. [name], M.D. Glenford Peers, M.D.  PROCEDURE DATE:  05/29/2011 PROCEDURE:  EGD with biopsy, [id number] ASA CLASS: INDICATIONS:  BARRETT'S SURVEILLANCE. LAST EGD JAN 2012 C/O OF DIARRHEA NOT RESPONDING TO CIP OR FLAGYL ALONE. NOW TAKING IRON  MEDICATIONS:   Demerol 100 mg IV, Versed 6 mg IV TOPICAL ANESTHETIC:  Cetacaine Spray  DESCRIPTION OF PROCEDURE:     Physical exam was performed. Informed consent was obtained from the patient after explaining the benefits, risks, and alternatives to the procedure.  The patient was connected to the monitor and placed in the left lateral position.  Continuous oxygen was provided by nasal cannula and IV medicine administered through an indwelling cannula.  After administration of sedation, the patient's esophagus was intubated and the [id number] ([id number]) endoscope was advanced under direct visualization to the second portion of the duodenum.  The scope was removed slowly by carefully examining the color, texture, anatomy, and integrity of the mucosa on the way out.  The patient was recovered in endoscopy and discharged home in satisfactory condition. <<PROCEDUREIMAGES>>  GE JUNCTION AT 35 CM FROM THE TEETH. Barrett's esophagus was found. BIOPSIES OBTAINED 29 CM, 31 CM, AND 33 CM FROM THE TEETH VIA COLD FORCEPS. Moderate gastritis was found & BIOPSIED VA COLD FORCEPS.  MODERATE Duodenitis was found.  A 3- 4 CM hiatal hernia was found. NL SECOND PORTION OF THE DUODENUM.  COMPLICATIONS:    None  ENDOSCOPIC IMPRESSION: 1) Barrett's esophagus  6-7 CM SEGNMENT 2) Moderate gastritis 3) MILD Duodenitis 4) MODERATE Hiatal hernia  RECOMMENDATIONS: AWAIT BIOPSIES PROTONIX DAILY CIP/FLAG BID FOR 7 DAYS HOLD  IRON FOR 7 DAYS OPV IN 3-4 WEEKS W/ [facility].  REPEAT EXAM:  No  ______________________________ Jonette Eva, MD  CC:  n. eSIGNED:   Merick Kelleher at 05/29/2011 03:19 PM  Quincy Simmonds, [phone]

## 2011-06-01 ENCOUNTER — Encounter: Payer: Self-pay | Admitting: Gastroenterology

## 2011-06-02 ENCOUNTER — Encounter (HOSPITAL_COMMUNITY): Payer: Self-pay | Admitting: Gastroenterology

## 2011-06-07 ENCOUNTER — Telehealth: Payer: Self-pay | Admitting: Gastroenterology

## 2011-06-07 NOTE — Telephone Encounter (Signed)
PLEASE CALL PT. HE HAS BARRETT'S BUT NO ADVANCED CHANGES. TAKE PROTONIX DAILY 30 MINUTES PRIOR TO MEALS. OPV IN LATE APR 2013 E 30 VISIT. REPEAT EGD IN 2016.

## 2011-06-08 NOTE — Telephone Encounter (Signed)
Called and informed his wife. She said he has taken the Cipro and probiotic and is still having a lot of diarrhea every day.Please advise!

## 2011-06-08 NOTE — Telephone Encounter (Signed)
Results Cc to PCP  

## 2011-06-09 MED ORDER — DICYCLOMINE HCL 10 MG PO CAPS: ORAL_CAPSULE | ORAL | Status: DC

## 2011-06-09 NOTE — Telephone Encounter (Addendum)
CALLED PT. REVIEWED EMR FROM 2004 TO PRESENT. HAS HAD DIARRHEA FOR YEARS. sX WERE CONTROLLED ON TINCTURE OF OPIUM WHICH I SNO LONGER AVAILABLE. HAVING BMs: 8 TIMES (WATERY, MUSHY). NO BLOOD IN STOOL. ABX DOESN'T SEEM TO HELP LIKE IT USED. ADD BENTYL 1-2 PO 30 MINUTES PRIOR TO MEALS AND AT BEDTIME. PT HAS TRIED HYOSCYAMINE IN THE PAST AND NO SUSTAINED IMPROVEMENT. MED SIDE EFFECTS DISCUSED: DROWSINESS, DRY MOUTH/EYES, AND DIFFICULTY URINATING. PT WILL CALL IN 2 WEEKS WITH FOLLOW UP. MAY NEED AN APPT AT [facility] FOR 2ND OPINION.

## 2011-06-09 NOTE — Telephone Encounter (Signed)
Addended by: West Bali on: 06/09/2011 08:12 AM   Modules accepted: Orders

## 2011-06-10 NOTE — Telephone Encounter (Signed)
Reminder in epic to have repeat EGD in 2016 and patient is aware of OV on 5/2 @ 0830 with SF

## 2011-06-25 ENCOUNTER — Ambulatory Visit: Payer: Medicare HMO | Admitting: Gastroenterology

## 2011-07-10 ENCOUNTER — Other Ambulatory Visit (HOSPITAL_COMMUNITY): Payer: Medicare HMO

## 2011-07-16 ENCOUNTER — Encounter: Payer: Self-pay | Admitting: Oncology

## 2011-08-04 ENCOUNTER — Encounter (HOSPITAL_COMMUNITY): Payer: Medicare HMO | Attending: Oncology

## 2011-08-04 ENCOUNTER — Telehealth: Payer: Self-pay

## 2011-08-04 DIAGNOSIS — D469 Myelodysplastic syndrome, unspecified: Secondary | ICD-10-CM

## 2011-08-04 LAB — CBC
Hemoglobin: 11 g/dL — ABNORMAL LOW (ref 13.0–17.0)
MCH: 35.4 pg — ABNORMAL HIGH (ref 26.0–34.0)
MCV: 103.9 fL — ABNORMAL HIGH (ref 78.0–100.0)
RBC: 3.11 MIL/uL — ABNORMAL LOW (ref 4.22–5.81)

## 2011-08-04 LAB — DIFFERENTIAL
Eosinophils Absolute: 0 10*3/uL (ref 0.0–0.7)
Eosinophils Relative: 0 % (ref 0–5)
Lymphs Abs: 1 10*3/uL (ref 0.7–4.0)
Monocytes Absolute: 0.8 10*3/uL (ref 0.1–1.0)
Monocytes Relative: 25 % — ABNORMAL HIGH (ref 3–12)

## 2011-08-04 MED ORDER — CIPROFLOXACIN HCL 500 MG PO TABS: ORAL_TABLET | ORAL | Status: AC

## 2011-08-04 MED ORDER — METRONIDAZOLE 500 MG PO TABS: ORAL_TABLET | ORAL | Status: AC

## 2011-08-04 NOTE — Telephone Encounter (Signed)
Referral was made to [facility] and they will contact the patient directly and patient is aware

## 2011-08-04 NOTE — Progress Notes (Signed)
Lab draw

## 2011-08-04 NOTE — Telephone Encounter (Signed)
Called and informed pt's wife. She is aware he will be referred to [facility].

## 2011-08-04 NOTE — Telephone Encounter (Signed)
pts wife called- pt is c/o diarrhea 12x per day. He is not having any pain, no N/V and no fever. He told her he felt like he had an infection again and wants to know if [facility] will call in abx to [facility] [place].

## 2011-08-04 NOTE — Telephone Encounter (Signed)
PLEASE CALL PT. RX FOR ABX SENT TO PHARMACY CIP/FLAG BID FOR 10 DAYS. CONTINUE BENTYL 1-2 PO 30 MINUTES PRIOR TO MEALS AND AT BEDTIME. MED SIDE EFFECTS INCLUDE DROWSINESS, DRY MOUTH/EYES, AND DIFFICULTY URINATING. PT NEEDS AN APPT AT [facility] FOR 2ND OPINION FOR RECURRENT POUCHITIS.

## 2011-09-04 ENCOUNTER — Encounter (HOSPITAL_COMMUNITY): Payer: Medicare HMO | Attending: Oncology

## 2011-09-04 ENCOUNTER — Telehealth: Payer: Self-pay | Admitting: Gastroenterology

## 2011-09-04 DIAGNOSIS — D469 Myelodysplastic syndrome, unspecified: Secondary | ICD-10-CM | POA: Insufficient documentation

## 2011-09-04 LAB — CBC
MCH: 34.8 pg — ABNORMAL HIGH (ref 26.0–34.0)
MCHC: 33.6 g/dL (ref 30.0–36.0)
MCV: 103.5 fL — ABNORMAL HIGH (ref 78.0–100.0)
Platelets: 128 10*3/uL — ABNORMAL LOW (ref 150–400)
RDW: 15 % (ref 11.5–15.5)

## 2011-09-04 LAB — DIFFERENTIAL
Basophils Absolute: 0 10*3/uL (ref 0.0–0.1)
Eosinophils Absolute: 0 10*3/uL (ref 0.0–0.7)
Eosinophils Relative: 0 % (ref 0–5)
Lymphs Abs: 0.9 10*3/uL (ref 0.7–4.0)

## 2011-09-04 LAB — COMPREHENSIVE METABOLIC PANEL
AST: 13 U/L (ref 0–37)
CO2: 28 mEq/L (ref 19–32)
Calcium: 9.2 mg/dL (ref 8.4–10.5)
Creatinine, Ser: 0.92 mg/dL (ref 0.50–1.35)
GFR calc Af Amer: >90 mL/min (ref >90)
GFR calc non Af Amer: 85 mL/min — ABNORMAL LOW (ref >90)

## 2011-09-04 LAB — URIC ACID: Uric Acid, Serum: 6.1 mg/dL (ref 4.0–7.8)

## 2011-09-04 NOTE — Telephone Encounter (Signed)
REVIEWED.  

## 2011-09-04 NOTE — Telephone Encounter (Signed)
Patient is scheduled to see Dr. Lorenda Peck at [facility] on July 26th at 10:00 am and Patients wife is aware.

## 2011-09-08 NOTE — Telephone Encounter (Signed)
I spoke to patients wife and she informed me that Mark Morrison has already saw Dr. Lorenda Peck at [facility] on June 28th and is due to follow up with him on Sept 20th and I verified that with the office so the July 26th appointment has been canceled and Dr. Lorenda Peck will f/u with Mark Morrison in Sept

## 2011-09-28 ENCOUNTER — Ambulatory Visit (HOSPITAL_COMMUNITY)
Admission: RE | Admit: 2011-09-28 | Discharge: 2011-09-28 | Disposition: A | Discharge status: Home / Self Care | Payer: Medicare HMO | Source: Ambulatory Visit | Attending: Oncology | Admitting: Oncology

## 2011-09-28 ENCOUNTER — Other Ambulatory Visit (HOSPITAL_COMMUNITY): Payer: Self-pay | Admitting: Oncology

## 2011-09-28 DIAGNOSIS — R918 Other nonspecific abnormal finding of lung field: Secondary | ICD-10-CM

## 2011-09-28 DIAGNOSIS — R911 Solitary pulmonary nodule: Secondary | ICD-10-CM

## 2011-09-28 DIAGNOSIS — J984 Other disorders of lung: Secondary | ICD-10-CM | POA: Insufficient documentation

## 2011-09-28 DIAGNOSIS — F17200 Nicotine dependence, unspecified, uncomplicated: Secondary | ICD-10-CM | POA: Insufficient documentation

## 2011-09-29 ENCOUNTER — Other Ambulatory Visit (HOSPITAL_COMMUNITY): Payer: Medicare HMO

## 2011-10-05 ENCOUNTER — Encounter (HOSPITAL_COMMUNITY): Payer: Medicare HMO | Attending: Oncology

## 2011-10-05 DIAGNOSIS — D469 Myelodysplastic syndrome, unspecified: Secondary | ICD-10-CM | POA: Insufficient documentation

## 2011-10-05 LAB — DIFFERENTIAL
Basophils Relative: 0 % (ref 0–1)
Eosinophils Absolute: 0 10*3/uL (ref 0.0–0.7)
Eosinophils Relative: 1 % (ref 0–5)
Lymphocytes Relative: 33 % (ref 12–46)
Lymphs Abs: 0.8 10*3/uL (ref 0.7–4.0)
Monocytes Absolute: 0.7 10*3/uL (ref 0.1–1.0)
Monocytes Relative: 29 % — ABNORMAL HIGH (ref 3–12)
Neutro Abs: 0.9 10*3/uL — ABNORMAL LOW (ref 1.7–7.7)
Neutrophils Relative %: 37 % — ABNORMAL LOW (ref 43–77)

## 2011-10-05 LAB — COMPREHENSIVE METABOLIC PANEL
Albumin: 3.6 g/dL (ref 3.5–5.2)
Alkaline Phosphatase: 56 U/L (ref 39–117)
BUN: 11 mg/dL (ref 6–23)
Calcium: 9 mg/dL (ref 8.4–10.5)
Creatinine, Ser: 0.9 mg/dL (ref 0.50–1.35)
GFR calc Af Amer: >90 mL/min (ref >90)
Glucose, Bld: 95 mg/dL (ref 70–99)
Total Protein: 7.7 g/dL (ref 6.0–8.3)

## 2011-10-05 LAB — CBC
HCT: 30.9 % — ABNORMAL LOW (ref 39.0–52.0)
Platelets: 104 10*3/uL — ABNORMAL LOW (ref 150–400)
RDW: 15.6 % — ABNORMAL HIGH (ref 11.5–15.5)
WBC: 2.5 10*3/uL — ABNORMAL LOW (ref 4.0–10.5)

## 2011-10-05 NOTE — Progress Notes (Signed)
Labs drawn today for cbc/diff,cmp 

## 2011-10-12 ENCOUNTER — Ambulatory Visit (HOSPITAL_COMMUNITY): Payer: Medicare HMO | Admitting: Oncology

## 2011-10-15 ENCOUNTER — Ambulatory Visit (HOSPITAL_COMMUNITY): Payer: Medicare HMO | Admitting: Oncology

## 2011-10-16 ENCOUNTER — Ambulatory Visit (HOSPITAL_COMMUNITY): Payer: Medicare HMO | Admitting: Oncology

## 2011-10-19 ENCOUNTER — Ambulatory Visit (HOSPITAL_COMMUNITY): Payer: Medicare HMO | Admitting: Oncology

## 2011-10-30 ENCOUNTER — Encounter (HOSPITAL_COMMUNITY): Payer: Medicare HMO | Attending: Oncology

## 2011-10-30 DIAGNOSIS — D469 Myelodysplastic syndrome, unspecified: Secondary | ICD-10-CM | POA: Insufficient documentation

## 2011-10-30 LAB — COMPREHENSIVE METABOLIC PANEL
ALT: 8 U/L (ref 0–53)
AST: 13 U/L (ref 0–37)
CO2: 27 mEq/L (ref 19–32)
Chloride: 102 mEq/L (ref 96–112)
Creatinine, Ser: 0.89 mg/dL (ref 0.50–1.35)
GFR calc Af Amer: >90 mL/min (ref >90)
GFR calc non Af Amer: 86 mL/min — ABNORMAL LOW (ref >90)
Glucose, Bld: 89 mg/dL (ref 70–99)
Total Bilirubin: 0.8 mg/dL (ref 0.3–1.2)

## 2011-10-30 LAB — CBC
HCT: 31.9 % — ABNORMAL LOW (ref 39.0–52.0)
Hemoglobin: 10.9 g/dL — ABNORMAL LOW (ref 13.0–17.0)
RBC: 3.09 MIL/uL — ABNORMAL LOW (ref 4.22–5.81)
RDW: 15.5 % (ref 11.5–15.5)
WBC: 3.2 10*3/uL — ABNORMAL LOW (ref 4.0–10.5)

## 2011-10-30 LAB — DIFFERENTIAL
Basophils Absolute: 0 10*3/uL (ref 0.0–0.1)
Lymphocytes Relative: 24 % (ref 12–46)
Lymphs Abs: 0.8 10*3/uL (ref 0.7–4.0)
Monocytes Absolute: 1.1 10*3/uL — ABNORMAL HIGH (ref 0.1–1.0)
Neutro Abs: 1.3 10*3/uL — ABNORMAL LOW (ref 1.7–7.7)

## 2011-10-30 NOTE — Progress Notes (Signed)
Labs drawn today for cbc/diff,cmp 

## 2011-12-02 ENCOUNTER — Ambulatory Visit (HOSPITAL_COMMUNITY): Payer: Medicare HMO | Admitting: Oncology

## 2011-12-11 ENCOUNTER — Encounter (HOSPITAL_COMMUNITY): Payer: Medicare HMO | Attending: Oncology

## 2011-12-11 DIAGNOSIS — D469 Myelodysplastic syndrome, unspecified: Secondary | ICD-10-CM

## 2011-12-11 LAB — CBC
MCH: 34.4 pg — ABNORMAL HIGH (ref 26.0–34.0)
MCHC: 33.8 g/dL (ref 30.0–36.0)
Platelets: 126 10*3/uL — ABNORMAL LOW (ref 150–400)

## 2011-12-11 LAB — DIFFERENTIAL
Basophils Absolute: 0 10*3/uL (ref 0.0–0.1)
Eosinophils Absolute: 0 10*3/uL (ref 0.0–0.7)
Monocytes Absolute: 1 10*3/uL (ref 0.1–1.0)
Neutrophils Relative %: 43 % (ref 43–77)

## 2011-12-11 NOTE — Progress Notes (Signed)
Labs drawn today for cbc/diff 

## 2012-01-01 ENCOUNTER — Encounter (HOSPITAL_COMMUNITY): Payer: Medicare Other | Attending: Oncology | Admitting: Oncology

## 2012-01-01 ENCOUNTER — Encounter (HOSPITAL_COMMUNITY): Payer: Self-pay | Admitting: Oncology

## 2012-01-01 VITALS — BP 106/66 | HR 75 | Temp 97.4°F | Resp 16 | Wt 172.4 lb

## 2012-01-01 DIAGNOSIS — R911 Solitary pulmonary nodule: Secondary | ICD-10-CM

## 2012-01-01 DIAGNOSIS — D469 Myelodysplastic syndrome, unspecified: Secondary | ICD-10-CM | POA: Insufficient documentation

## 2012-01-01 DIAGNOSIS — D61818 Other pancytopenia: Secondary | ICD-10-CM

## 2012-01-01 HISTORY — DX: Myelodysplastic syndrome, unspecified: D46.9

## 2012-01-01 NOTE — Patient Instructions (Addendum)
[facility]  Discharge Instructions  RECOMMENDATIONS MADE BY THE CONSULTANT AND ANY TEST RESULTS WILL BE SENT TO YOUR REFERRING DOCTOR.   We will schedule lab work every 8 weeks. Return to clinic in 6 months to see MD.   I acknowledge that I have been informed and understand all the instructions given to me and received a copy. I do not have any more questions at this time, but understand that I may call the Specialty Clinic at [facility] at [phone] during business hours should I have any further questions or need assistance in obtaining follow-up care.    __________________________________________  _____________  __________ Signature of Patient or Authorized Representative            Date                   Time    __________________________________________ Nurse's Signature

## 2012-01-01 NOTE — Progress Notes (Signed)
Mark Moh, MD [address] [place] [address]  1. MDS (myelodysplastic syndrome)     CURRENT THERAPY: Observation  INTERVAL HISTORY: Mark Morrison 66 y.o. 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    Mark Morrison is doing well. He is tolerating the ferrous sulfate tablets without any complaints including constipation, and nausea. He was encouraged to continue with the ferrous sulfate daily.  I personally reviewed and went over laboratory results with the patient.  His pancytopenia remains very stable. We spent some time discussing his laboratory work together. He denies any questions after this discussion.  I personally reviewed and went over radiographic studies with the patient.  CT of chest performed in August of 2013 showed stability of bilateral pulmonary nodules. It was recommended that a subsequent CT of chest be performed in 18-24 months.  So we will repeat a CT of chest in August of 2013. Unfortunately, this example cannot be scheduled to 2 being outside of one year. I printed a copy of the CT report for the patient to go home with. I provided education regarding pulmonary nodules.  The patient was seen at [facility] by Dr. Lowell Guitar in consultation. It was determined not to pursue any therapy or intervention at this point time and simply follow the patient with laboratory work. The patient is agreeable to this plan.  The patient denies any complaints. He denies any B. symptomatology. Complete ROS questioning is negative.  Past Medical History  Diagnosis Date  . Ulcerative colitis 2004    with IPPA  . 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 History of DVT (deep vein thrombosis)     and PE  . Barrett's esophagus 02/2010    on EGD  . Vitamin B 12 deficiency   . BPH (benign prostatic hyperplasia)   . GERD  (gastroesophageal reflux disease)   . Pulmonary nodules 04/01/2011  . Blood dyscrasia     dms,    per Dr. Leonia Corona  . COPD (chronic obstructive pulmonary disease)     emphasema,   xray done and 4 small areas found on test  . 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      has COLONIC POLYPS; ANEMIA; THROMBOCYTOPENIA; LEUKOPENIA, MILD; TOBACCO ABUSE; ULCERATIVE COLITIS; Pouchitis; DIARRHEA; PULMONARY EMBOLISM, HX OF; BENIGN PROSTATIC HYPERTROPHY, HX OF; BARRETTS ESOPHAGUS; Pancytopenia; Pulmonary nodules; and MDS (myelodysplastic syndrome) on his problem list.      has no known allergies.  Mark Morrison does not currently have medications on file.  Past Surgical History  Procedure Date  . Knee surgery     Left  . Abdominoperineal proctocolectomy 2004    with ileal anastomosis and anal pouch creation at [facility]  . Colonoscopy 11/22/2006    ULCERATIVE COLITIS  . Transurethral resection of prostate 02/03/2011    Procedure: TRANSURETHRAL RESECTION OF THE PROSTATE (TURP);  Surgeon: Ky Barban;  Location: [facility];  Service: Urology;  Laterality: N/A;  . Ileoscopy 03/14/2010    geographic ulceratios of the ileal pouch c/w pouchitis, more proximal distal ileum mucosa normal, biospy from ulcer benign  . Esophagogastroduodenoscopy 03/14/10    barret's esophagus, small hh, erosive duodenitis  . Colectomy   . Esophagogastroduodenoscopy 05/29/2011    Procedure: ESOPHAGOGASTRODUODENOSCOPY (EGD);  Surgeon: West Bali, MD;  Location: [facility]  ENDO SUITE;  Service: Endoscopy;  Laterality: N/A;  1:30    Denies any headaches, dizziness, double vision, fevers, chills, night sweats, nausea, vomiting, diarrhea, constipation, chest pain, heart palpitations, shortness of breath, blood in stool, black tarry stool, urinary pain, urinary burning, urinary frequency, hematuria.   PHYSICAL EXAMINATION  ECOG PERFORMANCE STATUS: 1 -  Symptomatic but completely ambulatory  Filed Vitals:   01/01/12 1020  BP: 106/66  Pulse: 75  Temp: 97.4 F (36.3 C)  Resp: 16    GENERAL:alert, no distress, well nourished, well developed, anxious, comfortable, cooperative and smiling SKIN: skin color, texture, turgor are normal, no rashes or significant lesions HEAD: Normocephalic, No masses, lesions, tenderness or abnormalities EYES: normal, Conjunctiva are pink and non-injected EARS: External ears normal OROPHARYNX:mucous membranes are moist  NECK: supple, trachea midline, B/L anterior cervical chain nodes (1 on each side) measuring 1 cm round.  Both are soft and mobile.   BREAST:not examined LUNGS: clear to auscultation and percussion HEART: regular rate & rhythm, no murmurs, no gallops, S1 normal and S2 normal ABDOMEN:abdomen soft, non-tender and normal bowel sounds BACK: Back symmetric, no curvature. EXTREMITIES:less then 2 second capillary refill, no skin discoloration, no cyanosis, positive findings:  clubbing  NEURO: alert & oriented x 3 with fluent speech, no focal motor/sensory deficits, gait normal    LABORATORY DATA: CBC    Component Value Date/Time   WBC 3.8* 12/11/2011 0830   RBC 3.26* 12/11/2011 0830   HGB 11.2* 12/11/2011 0830   HCT 33.1* 12/11/2011 0830   PLT 126* 12/11/2011 0830   MCV 101.5* 12/11/2011 0830   MCH 34.4* 12/11/2011 0830   MCHC 33.8 12/11/2011 0830   RDW 15.1 12/11/2011 0830   LYMPHSABS 1.2 12/11/2011 0830   MONOABS 1.0 12/11/2011 0830   EOSABS 0.0 12/11/2011 0830   BASOSABS 0.0 12/11/2011 0830      Chemistry      Component Value Date/Time   NA 136 10/30/2011 0925   K 4.1 10/30/2011 0925   CL 102 10/30/2011 0925   CO2 27 10/30/2011 0925   BUN 12 10/30/2011 0925   CREATININE 0.89 10/30/2011 0925      Component Value Date/Time   CALCIUM 9.0 10/30/2011 0925   ALKPHOS 60 10/30/2011 0925   AST 13 10/30/2011 0925   ALT 8 10/30/2011 0925   BILITOT 0.8 10/30/2011 0925       RADIOGRAPHIC  STUDIES:  09/28/2011  *RADIOLOGY REPORT*  Clinical Data: Pulmonary nodules. Smoking history.  CT CHEST WITHOUT CONTRAST  Technique: Multidetector CT imaging of the chest was performed  following the standard protocol without IV contrast.  Comparison: 04/01/2011  Findings: Small lymph nodes are seen in both axillary regions, but  there is no axillary, mediastinal, or hilar lymphadenopathy. Heart  size is normal. Coronary artery calcification is noted. No  pericardial or pleural effusion. Ascending aorta measures 4.0 cm in  diameter.  The previously identified pulmonary nodules are stable (see lingula  on image 39 and right lower lobe on image 42). Linear band of  subsegmental atelectasis or scarring again noted in the posterior  right lower lobe, unchanged. No new or progressive finding in  either lung.  Imaging through the upper abdomen reveals a 6 mm calcified stone in  the gallbladder. The previously visualized right renal lesion has  not been included on today's imaging.  Bone windows reveal no worrisome lytic or sclerotic osseous  lesions.  IMPRESSION:  No interval change in the tiny bilateral pulmonary parenchymal  nodules in  the 70-month interval since the prior study. Follow-up  CT chest without contrast in 18-24 months is recommended to insure  continued stability. This recommendation follows the consensus  statement: Guidelines for Management of Small Pulmonary Nodules  Detected on CT Scans: A Statement from the Fleischner Society as  published in Radiology 2005; 237:395-400.  Original Report Authenticated By: ERIC A. MANSELL, M.D.     ASSESSMENT:  1. 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  S/P bone marrow aspiration and biopsy. 2. Chronic obstructive pulmonary disease.  3. Small pulmonary nodules which need a CT scan again in August and that is scheduled.  4. Benign  prostatic hyperplasia status post surgical intervention by Dr. Jerre Simon 02/03/2011.  5. History of gastroesophageal reflux disease.  6. History of ulcerative colitis status post colectomy.  7. 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  8. History of deep venous thrombosis and a pulmonary embolus in the past.   PLAN:  1. I personally reviewed and went over laboratory results with the patient. 2. I personally reviewed and went over radiographic studies with the patient. 3. Labs every 8 weeks: CBC diff 4. CT of chest without contrast in August 1015 to evaluate stability of pulmonary nodules 5. Continue Ferrous Sulfate tablets 6. Return in 6 weeks for follow-up.     All questions were answered. The patient knows to call the clinic with any problems, questions or concerns. We can certainly see the patient much sooner if necessary.  The patient and plan discussed with Glenford Peers, MD and he is in agreement with the aforementioned.  Yarianna Varble

## 2012-02-26 ENCOUNTER — Encounter (HOSPITAL_COMMUNITY): Payer: Medicare Other | Attending: Oncology

## 2012-02-26 DIAGNOSIS — D469 Myelodysplastic syndrome, unspecified: Secondary | ICD-10-CM | POA: Insufficient documentation

## 2012-02-26 LAB — DIFFERENTIAL
Basophils Absolute: 0 10*3/uL (ref 0.0–0.1)
Eosinophils Relative: 1 % (ref 0–5)
Lymphocytes Relative: 25 % (ref 12–46)
Neutro Abs: 2 10*3/uL (ref 1.7–7.7)
Neutrophils Relative %: 46 % (ref 43–77)

## 2012-02-26 LAB — CBC
Platelets: 126 10*3/uL — ABNORMAL LOW (ref 150–400)
RDW: 15.6 % — ABNORMAL HIGH (ref 11.5–15.5)
WBC: 4.2 10*3/uL (ref 4.0–10.5)

## 2012-02-26 NOTE — Progress Notes (Signed)
Labs drawn today for cbc/diff 

## 2012-03-10 ENCOUNTER — Encounter: Payer: Self-pay | Admitting: Gastroenterology

## 2012-03-14 ENCOUNTER — Encounter: Payer: Self-pay | Admitting: Gastroenterology

## 2012-03-14 ENCOUNTER — Ambulatory Visit (INDEPENDENT_AMBULATORY_CARE_PROVIDER_SITE_OTHER): Payer: MEDICARE | Admitting: Gastroenterology

## 2012-03-14 VITALS — BP 100/59 | HR 83 | Temp 97.4°F | Ht 65.0 in | Wt 172.4 lb

## 2012-03-14 DIAGNOSIS — K91850 Pouchitis: Secondary | ICD-10-CM

## 2012-03-14 DIAGNOSIS — K2270 Barrett's esophagus without dysplasia: Secondary | ICD-10-CM

## 2012-03-14 MED ORDER — DICYCLOMINE HCL 10 MG PO CAPS: ORAL_CAPSULE | ORAL | Status: DC

## 2012-03-14 MED ORDER — METRONIDAZOLE 500 MG PO TABS: 500.0000 mg | ORAL_TABLET | Freq: Two times a day (BID) | ORAL | Status: DC

## 2012-03-14 MED ORDER — CIPROFLOXACIN HCL 500 MG PO TABS: 500.0000 mg | ORAL_TABLET | Freq: Two times a day (BID) | ORAL | Status: DC

## 2012-03-14 MED ORDER — PANTOPRAZOLE SODIUM 40 MG PO TBEC: 40.0000 mg | DELAYED_RELEASE_TABLET | Freq: Every day | ORAL | Status: DC

## 2012-03-14 NOTE — Patient Instructions (Addendum)
I have sent a prescription for 2 antibiotics to your pharmacy (Cipro and Flagyl). Take this twice a day for 10 days. Please call if no improvement.  I have provided the prescription for Protonix and dicyclomine.   We will see you back in 6 months or sooner if needed.

## 2012-03-14 NOTE — Progress Notes (Signed)
Referring Provider: Londell Moh,* Primary Care Physician:  Londell Moh, MD Primary Gastroenterologist: Dr. Darrick Penna   Chief Complaint  Patient presents with  . Medication Refill  . Diarrhea    HPI:   Mr. Paynter is a 67 year old male who returns today for medication refills. He has history of ulcerative colitis status post proctocolectomy with ileal anastomosis and anal pouch creation and history of recurrent pouchitis. Also hx notable for Barrett's, with last EGD in April 2013. Needs surveillance in April 2016. He has required multiple rounds of abx due to recurrent pouchitis. Per our records, he was referred to [facility] for further evaluation in June 2013.  Wife present. Feels like he needs some abx due to recurrent pouchitis. Denies any fever/chills. Denies abdominal pain. No N/V. Was seen at [facility] and told to try Imodium 30 minutes prior to eating. Taking Dicyclomine. Stools 5-6 times usually per day. Good appetite.     Past Medical History  Diagnosis Date  . Ulcerative colitis 2004    with IPPA  . 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 History of DVT (deep vein thrombosis)     and PE  . Barrett's esophagus 02/2010    on EGD  . Vitamin B 12 deficiency   . BPH (benign prostatic hyperplasia)   . GERD (gastroesophageal reflux disease)   . Pulmonary nodules 04/01/2011  . Blood dyscrasia     dms,    per Dr. Leonia Corona  . COPD (chronic obstructive pulmonary disease)     emphasema,   xray done and 4 small areas found on test  . 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      Past Surgical History  Procedure Date  . Knee surgery     Left  . Abdominoperineal proctocolectomy 2004    with ileal anastomosis and anal pouch creation at [facility]  . Colonoscopy 11/22/2006    ULCERATIVE COLITIS  . Transurethral resection of prostate 02/03/2011    Procedure: TRANSURETHRAL RESECTION OF  THE PROSTATE (TURP);  Surgeon: Ky Barban;  Location: [facility];  Service: Urology;  Laterality: N/A;  . Ileoscopy 03/14/2010    geographic ulceratios of the ileal pouch c/w pouchitis, more proximal distal ileum mucosa normal, biospy from ulcer benign  . Esophagogastroduodenoscopy 03/14/10    barret's esophagus, small hh, erosive duodenitis  . Colectomy   . Esophagogastroduodenoscopy 05/29/2011    SLF: Barrett's esophagus  6-7 CM SEGNMENT/ Moderate gastritis/ MILD Duodenitis/ MODERATE Hiatal hernia    Current Outpatient Prescriptions  Medication Sig Dispense Refill  . cyanocobalamin (,VITAMIN B-12,) 1000 MCG/ML injection Inject 1,000 mcg into the muscle once.      . dicyclomine (BENTYL) 10 MG capsule 1-2 PO QAC AND HS  62 capsule  11  . ferrous sulfate 325 (65 FE) MG tablet Take 325 mg by mouth daily with breakfast.      . fish oil-omega-3 fatty acids 1000 MG capsule Take 2 g by mouth daily. Doesn't take everyday.      . Lactase (LACTAID PO) Take by mouth.      . Multiple Vitamin (MULITIVITAMIN WITH MINERALS) TABS Take 1 tablet by mouth daily.      . pantoprazole (PROTONIX) 40 MG tablet Take 1 tablet (40 mg total) by mouth daily.  90 tablet  3  . Probiotic Product (ALIGN) 4 MG CAPS Take 4 mg by mouth daily.  30 capsule  0  . vitamin  C (ASCORBIC ACID) 500 MG tablet Take 500 mg by mouth daily.        Marland Kitchen loperamide (IMODIUM) 2 MG capsule Take 2 mg by mouth 3 (three) times daily as needed.        Allergies as of 03/14/2012  . (No Known Allergies)    Family History  Problem Relation Age of Onset  . Anesthesia problems Neg Hx   . Hypotension Neg Hx   . Malignant hyperthermia Neg Hx   . Pseudochol deficiency Neg Hx     History   Social History  . Marital Status: Married    Spouse Name: N/A    Number of Children: 2  . Years of Education: N/A   Occupational History  . retired, Therapist, sports, Psychologist, occupational    Social History Main Topics  . Smoking status: Former Smoker -- 1.0 packs/day for  50 years    Types: Cigarettes    Quit date: 03/23/2011  . Smokeless tobacco: Former Neurosurgeon    Quit date: 04/12/2011  . Alcohol Use: No  . Drug Use: No  . Sexually Active: Not Currently   Other Topics Concern  . None   Social History Narrative  . None    Review of Systems: Negative unless mentioned in HPI  Physical Exam: BP 100/59  Pulse 83  Temp 97.4 F (36.3 C) (Oral)  Ht 5\' 5"  (1.651 m)  Wt 172 lb 6.4 oz (78.2 kg)  BMI 28.69 kg/m2 General:   Alert and oriented. No distress noted. Pleasant and cooperative.  Head:  Normocephalic and atraumatic. Eyes:  Conjuctiva clear without scleral icterus. Neck:  Supple, without mass or thyromegaly. Heart:  S1, S2 present without murmurs, rubs, or gallops. Regular rate and rhythm. Abdomen:  +BS, soft, non-tender and non-distended. No rebound or guarding. Multiple well-healed surgical scars. Msk:  Symmetrical without gross deformities. Normal posture. Pulses:  2+ DP noted bilaterally Extremities:  clubbing Neurologic:  Alert and  oriented x4;  grossly normal neurologically. Skin:  Intact without significant lesions or rashes. Cervical Nodes:  No significant cervical adenopathy. Psych:  Alert and cooperative. Normal mood and affect.

## 2012-03-14 NOTE — Progress Notes (Signed)
Faxed to PCP

## 2012-03-14 NOTE — Assessment & Plan Note (Signed)
Due for surveillance April 2016. Refills of Protonix provided. Continue indefinitely. Return in 6 mos.

## 2012-03-14 NOTE — Progress Notes (Unsigned)
  Doris:  Can we contact pt and find out why he did not complete the flex sig and HBT?   It appears he was scheduled for a hydrogen breath test and flex sig. For reasons unknown, this was cancelled. At this time, I feel it is important he proceed with this. We will refer again to [facility] to continue the work-up.   Please make sure he establishes care again at [facility]. He was last seen there in June 2013 with Dr. Lorenda Peck. He shouldn't need a referral again.

## 2012-03-14 NOTE — Assessment & Plan Note (Signed)
Question recurrent pouchitis. Pt has been treated with Cipro and Flagyl in the past. Has established care with Dr. Lorenda Peck at [facility].   Treat with short course Cipro and Flagyl.  After pt had left, I was able to access [facility] records for further review. It appears he was scheduled for a hydrogen breath test and flex sig. For reasons unknown, this was cancelled. At this time, I feel it is important he proceed with this. We will refer again to [facility] to continue the work-up.   6 mos return.

## 2012-03-18 NOTE — Progress Notes (Signed)
LMOM to call.

## 2012-03-21 NOTE — Progress Notes (Signed)
Called and spoke to pt's wife, he was not in. She said that he just didn't feel the need to go and complete the tests after his diarrhea got better. She said he has had so many tests, he just did not do it. She will let him know the recommendation and call back and let us know what he says, but she doesn't feel that he will do it.

## 2012-03-22 NOTE — Progress Notes (Signed)
Thank you for checking. He needs to establish care again with [facility] and follow through with these tests. It is important to assess for other etiologies of the diarrhea; we will see him here in 6 months. Needs to see [facility] in the interim.

## 2012-04-22 ENCOUNTER — Encounter (HOSPITAL_COMMUNITY): Payer: Medicare Other | Attending: Oncology

## 2012-04-22 DIAGNOSIS — D469 Myelodysplastic syndrome, unspecified: Secondary | ICD-10-CM | POA: Insufficient documentation

## 2012-04-22 LAB — DIFFERENTIAL
Basophils Relative: 0 % (ref 0–1)
Eosinophils Absolute: 0 10*3/uL (ref 0.0–0.7)
Eosinophils Relative: 0 % (ref 0–5)
Monocytes Absolute: 1.7 10*3/uL — ABNORMAL HIGH (ref 0.1–1.0)
Monocytes Relative: 38 % — ABNORMAL HIGH (ref 3–12)
Neutrophils Relative %: 44 % (ref 43–77)

## 2012-04-22 LAB — CBC
HCT: 32.8 % — ABNORMAL LOW (ref 39.0–52.0)
Hemoglobin: 11 g/dL — ABNORMAL LOW (ref 13.0–17.0)
MCH: 34.2 pg — ABNORMAL HIGH (ref 26.0–34.0)
MCHC: 33.5 g/dL (ref 30.0–36.0)
MCV: 101.9 fL — ABNORMAL HIGH (ref 78.0–100.0)

## 2012-04-22 NOTE — Progress Notes (Signed)
Labs drawn today for cbc/diff 

## 2012-05-05 NOTE — Progress Notes (Signed)
REVIEWED.  

## 2012-06-17 ENCOUNTER — Other Ambulatory Visit (HOSPITAL_COMMUNITY): Payer: Medicare HMO

## 2012-06-24 ENCOUNTER — Other Ambulatory Visit (HOSPITAL_COMMUNITY): Payer: Self-pay | Admitting: Oncology

## 2012-06-24 ENCOUNTER — Encounter (HOSPITAL_COMMUNITY): Payer: Medicare Other | Attending: Oncology

## 2012-06-24 DIAGNOSIS — D469 Myelodysplastic syndrome, unspecified: Secondary | ICD-10-CM

## 2012-06-24 DIAGNOSIS — D649 Anemia, unspecified: Secondary | ICD-10-CM | POA: Insufficient documentation

## 2012-06-24 DIAGNOSIS — F411 Generalized anxiety disorder: Secondary | ICD-10-CM | POA: Insufficient documentation

## 2012-06-24 LAB — CBC
MCH: 32.7 pg (ref 26.0–34.0)
MCHC: 33 g/dL (ref 30.0–36.0)
MCV: 99 fL (ref 78.0–100.0)
Platelets: 77 10*3/uL — ABNORMAL LOW (ref 150–400)
RDW: 16.3 % — ABNORMAL HIGH (ref 11.5–15.5)

## 2012-06-24 LAB — DIFFERENTIAL
Eosinophils Absolute: 0 10*3/uL (ref 0.0–0.7)
Lymphs Abs: 0.3 10*3/uL — ABNORMAL LOW (ref 0.7–4.0)
Monocytes Absolute: 0.9 10*3/uL (ref 0.1–1.0)
Neutrophils Relative %: 59 % (ref 43–77)
WBC Morphology: INCREASED

## 2012-06-24 NOTE — Progress Notes (Signed)
Labs drawn today for cbc/diff 

## 2012-07-01 ENCOUNTER — Encounter (HOSPITAL_BASED_OUTPATIENT_CLINIC_OR_DEPARTMENT_OTHER): Payer: Medicare Other | Admitting: Oncology

## 2012-07-01 ENCOUNTER — Encounter (HOSPITAL_COMMUNITY): Payer: Self-pay | Admitting: Oncology

## 2012-07-01 VITALS — BP 105/63 | HR 89 | Temp 97.9°F | Resp 16 | Wt 165.4 lb

## 2012-07-01 DIAGNOSIS — D469 Myelodysplastic syndrome, unspecified: Secondary | ICD-10-CM

## 2012-07-01 DIAGNOSIS — D61818 Other pancytopenia: Secondary | ICD-10-CM

## 2012-07-01 DIAGNOSIS — Z86718 Personal history of other venous thrombosis and embolism: Secondary | ICD-10-CM

## 2012-07-01 DIAGNOSIS — R1310 Dysphagia, unspecified: Secondary | ICD-10-CM

## 2012-07-01 DIAGNOSIS — F419 Anxiety disorder, unspecified: Secondary | ICD-10-CM

## 2012-07-01 MED ORDER — LORAZEPAM 0.5 MG PO TABS: 0.5000 mg | ORAL_TABLET | Freq: Two times a day (BID) | ORAL | Status: DC | PRN

## 2012-07-01 NOTE — Patient Instructions (Addendum)
[facility] Discharge Instructions  RECOMMENDATIONS MADE BY THE CONSULTANT AND ANY TEST RESULTS WILL BE SENT TO YOUR REFERRING PHYSICIAN.  EXAM FINDINGS BY THE PHYSICIAN TODAY AND SIGNS OR SYMPTOMS TO REPORT TO CLINIC OR PRIMARY PHYSICIAN: Exam and discussion by PA.  Your blood work shows that your counts are dropping some but will only watch for now.  Will check your blood work every 8 weeks.  MEDICATIONS PRESCRIBED:  Refill for Ativan given - will need to get future refills from primary care physician.  INSTRUCTIONS GIVEN AND DISCUSSED: Report increased fatigue, shortness of breath, etc.  SPECIAL INSTRUCTIONS/FOLLOW-UP: Blood work every 8 weeks, CT of your chest in August and to see MD in 6 months.  Thank you for choosing [facility] to provide your oncology and hematology care.  To afford each patient quality time with our providers, please arrive at least 15 minutes before your scheduled appointment time.  With your help, our goal is to use those 15 minutes to complete the necessary work-up to ensure our physicians have the information they need to help with your evaluation and healthcare recommendations.    Effective January 1st, 2014, we ask that you re-schedule your appointment with our physicians should you arrive 10 or more minutes late for your appointment.  We strive to give you quality time with our providers, and arriving late affects you and other patients whose appointments are after yours.    Again, thank you for choosing [facility].  Our hope is that these requests will decrease the amount of time that you wait before being seen by our physicians.       _____________________________________________________________  Should you have questions after your visit to [facility], please contact our office at [phone] between the hours of 8:30 a.m. and 5:00 p.m.  Voicemails left after 4:30 p.m. will not be returned  until the following business day.  For prescription refill requests, have your pharmacy contact our office with your prescription refill request.

## 2012-07-01 NOTE — Progress Notes (Signed)
Mark Moh, MD [address] [place] [address]  MDS (myelodysplastic syndrome)  CURRENT THERAPY: Observation  INTERVAL HISTORY: Mark Morrison 67 y.o. 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   I personally reviewed and went over laboratory results with the patient.  Mark Morrison his hemoglobin has dropped a little bit from 11 g in February of 2014-9.9 g on 06/24/2012. His white blood cell count stable at 2.8. His platelet is slightly decreased to 77,000 compared to 110,000 in February 2014.  Fortunately, Mark Morrison feels well. He denies any fevers, chills, night sweats, unintentional weight loss, early satiety, and he reports his appetite is strong.  His wife who accompanies him today asks if we can get him something for his nervousness. She reports that when he had his bone marrow test, Dr. Mariel Sleet provide him with a prescription to help with his nerves. I suspect that was Xanax as that but we premedicate most patients with prior to that procedure. I will give him a prescription for Ativan 0.5 mg one tablet every 12 hours as needed for anxiety. I've given him 30 tablets with 0 refills and he was asked to get future refills from his primary care physician.  He asked me, "cannot eat popcorn." I've asked her to followup with his GI specialist, Dr. Darrick Penna regarding a question.  He does report some intermittent dysphagia. He reports that sometimes food products "get stuck." He denies any choking. I've asked him to followup with his GI specialists regarding this as well.  Hematologically, the patient denies any complaints and ROS questioning is negative.  Mark Morrison has a CT of chest without contrast scheduled for August to followup on pulmonary nodules.    Past Medical History  Diagnosis Date  . Ulcerative colitis 2004   with IPPA  . 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 History of DVT (deep vein thrombosis)     and PE  . Barrett's esophagus 02/2010    on EGD  . Vitamin B 12 deficiency   . BPH (benign prostatic hyperplasia)   . GERD (gastroesophageal reflux disease)   . Pulmonary nodules 04/01/2011  . Blood dyscrasia     dms,    per Dr. Leonia Corona  . COPD (chronic obstructive pulmonary disease)     emphasema,   xray done and 4 small areas found on test  . 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      has COLONIC POLYPS; ANEMIA; THROMBOCYTOPENIA; LEUKOPENIA, MILD; TOBACCO ABUSE; ULCERATIVE COLITIS; Pouchitis; DIARRHEA; PULMONARY EMBOLISM, HX OF; BENIGN PROSTATIC HYPERTROPHY, HX OF; BARRETTS ESOPHAGUS; Pancytopenia; Pulmonary nodules; and MDS (myelodysplastic syndrome) on his problem list.     has No Known Allergies.  [name] had no medications administered during this visit.  Past Surgical History  Procedure Laterality Date  . Knee surgery      Left  . Abdominoperineal proctocolectomy  2004    with ileal anastomosis and anal pouch creation at [facility]  . Colonoscopy  11/22/2006    ULCERATIVE COLITIS  . Transurethral resection of prostate  02/03/2011    Procedure: TRANSURETHRAL RESECTION OF THE PROSTATE (TURP);  Surgeon: Ky Barban;  Location: [facility];  Service: Urology;  Laterality: N/A;  . Ileoscopy  03/14/2010    geographic ulceratios of the ileal pouch  c/w pouchitis, more proximal distal ileum mucosa normal, biospy from ulcer benign  . Esophagogastroduodenoscopy  03/14/10    barret's esophagus, small hh, erosive duodenitis  . Colectomy    . Esophagogastroduodenoscopy  05/29/2011    SLF: Barrett's esophagus  6-7 CM SEGNMENT/ Moderate gastritis/ MILD Duodenitis/ MODERATE Hiatal hernia, Surveillance due 2016    Denies any headaches, dizziness, double vision, fevers, chills, night sweats, nausea,  vomiting, diarrhea, constipation, chest pain, heart palpitations, shortness of breath, blood in stool, black tarry stool, urinary pain, urinary burning, urinary frequency, hematuria.   PHYSICAL EXAMINATION  ECOG PERFORMANCE STATUS: 0 - Asymptomatic  Filed Vitals:   07/01/12 1000  BP: 105/63  Pulse: 89  Temp: 97.9 F (36.6 C)  Resp: 16    GENERAL:alert, no distress, well nourished, well developed, comfortable, cooperative and smiling SKIN: skin color, texture, turgor are normal, no rashes or significant lesions HEAD: Normocephalic, No masses, lesions, tenderness or abnormalities EYES: normal, Conjunctiva are pink and non-injected EARS: External ears normal OROPHARYNX:mucous membranes are moist  NECK: supple, no adenopathy, thyroid normal size, non-tender, without nodularity, no stridor, non-tender, trachea midline LYMPH:  no palpable lymphadenopathy, no hepatosplenomegaly BREAST:not examined LUNGS: clear to auscultation and percussion, decreased breath sounds HEART: regular rate & rhythm, no murmurs, no gallops, S1 normal and S2 normal ABDOMEN:abdomen soft, non-tender and normal bowel sounds BACK: Back symmetric, no curvature., No CVA tenderness EXTREMITIES:less then 2 second capillary refill, no joint deformities, effusion, or inflammation, no edema, no skin discoloration, clubbing, no cyanosis  NEURO: alert & oriented x 3 with fluent speech, no focal motor/sensory deficits, gait normal   LABORATORY DATA: CBC    Component Value Date/Time   WBC 2.8* 06/24/2012 1033   RBC 3.03* 06/24/2012 1033   HGB 9.9* 06/24/2012 1033   HCT 30.0* 06/24/2012 1033   PLT 77* 06/24/2012 1033   MCV 99.0 06/24/2012 1033   MCH 32.7 06/24/2012 1033   MCHC 33.0 06/24/2012 1033   RDW 16.3* 06/24/2012 1033   LYMPHSABS 0.3* 06/24/2012 1033   MONOABS 0.9 06/24/2012 1033   EOSABS 0.0 06/24/2012 1033   BASOSABS 0.0 06/24/2012 1033        ASSESSMENT:  1. 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 S/P bone marrow aspiration and biopsy.  2. Chronic obstructive pulmonary disease.  3. Small pulmonary nodules which need a CT scan again in August and that is scheduled.  4. Benign prostatic hyperplasia status post surgical intervention by Dr. Jerre Simon 02/03/2011.  5. History of gastroesophageal reflux disease.  6. History of ulcerative colitis status post colectomy.  7. 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  8. History of deep venous thrombosis and a pulmonary embolus in the past. 9. Intermittent dysphagia, recommend follow-up with GI.   PLAN:  1. I personally reviewed and went over laboratory results with the patient. 2. CBC diff every 8 weeks.  3. CT chest without contrast in August 2014 to follow-up on pulmonary nodules.  4. Continue ferrous sulfate tablets.  5. Rx for Ativan 0.5 mg for anxiety #30.  Future refills will need to be performed by PCP.  6. Recommend follow-up with GI regarding dysphagia 7. Return in 6 months for follow-up   All questions were answered. The patient knows to call the clinic with any problems, questions or concerns. We can certainly see the patient much sooner if necessary.  Patient and plan will be discussed with Dr. Mariel Sleet within the next  24 hours.   KEFALAS,THOMAS

## 2012-07-08 ENCOUNTER — Other Ambulatory Visit (HOSPITAL_COMMUNITY): Payer: Self-pay

## 2012-07-08 DIAGNOSIS — D649 Anemia, unspecified: Secondary | ICD-10-CM

## 2012-07-08 DIAGNOSIS — D469 Myelodysplastic syndrome, unspecified: Secondary | ICD-10-CM

## 2012-07-11 ENCOUNTER — Encounter (HOSPITAL_BASED_OUTPATIENT_CLINIC_OR_DEPARTMENT_OTHER): Payer: Medicare Other

## 2012-07-11 DIAGNOSIS — D649 Anemia, unspecified: Secondary | ICD-10-CM

## 2012-07-11 DIAGNOSIS — D469 Myelodysplastic syndrome, unspecified: Secondary | ICD-10-CM

## 2012-07-11 LAB — FERRITIN: Ferritin: 97 ng/mL (ref 22–322)

## 2012-07-11 LAB — IRON AND TIBC
Saturation Ratios: 21 % (ref 20–55)
UIBC: 213 ug/dL (ref 125–400)

## 2012-07-11 NOTE — Progress Notes (Signed)
Labs drawn today for b12,folate,Iron and IBC,ferr

## 2012-08-12 ENCOUNTER — Telehealth: Payer: Self-pay

## 2012-08-12 MED ORDER — CIPROFLOXACIN HCL 500 MG PO TABS: ORAL_TABLET | ORAL | Status: DC

## 2012-08-12 MED ORDER — METRONIDAZOLE 500 MG PO TABS: ORAL_TABLET | ORAL | Status: DC

## 2012-08-12 NOTE — Telephone Encounter (Signed)
Daved Mcfann- pts spouse called [phone]) pt is having "a bunch" of diarrhea for "a few days". She couldn't tell me a number of times per day, just "a bunch". She said he has no fever, no N/V, no pain. She stated he has had this before and we have given him some abx to take care of it. Per [facility] ov note from January- she gave him 10 days of cipro and flagyl. They are requesting a refill on this. Please advise. Pt uses [facility].

## 2012-08-12 NOTE — Telephone Encounter (Signed)
PLEASE CALL PT. RX FOR CIPRO/FLAGYL BID FOR 10 DAYS SENT TO PHARMACY. MED SIDE EFFECTS INCLUDE ANKLE PAIN, NAUSEA/VOMITING, DIARRHEA, ABD PAIN, AND METALLIC TASTE. DO NOT CONSUME ALCOHOL EVEN IN COUGH SYRUP. IT WILL CAUSE VOMITING.

## 2012-08-12 NOTE — Telephone Encounter (Signed)
Pt's wife is aware of Rx called in to mail order.

## 2012-08-24 ENCOUNTER — Encounter (HOSPITAL_COMMUNITY): Payer: Medicare Other | Attending: Oncology

## 2012-08-24 DIAGNOSIS — D469 Myelodysplastic syndrome, unspecified: Secondary | ICD-10-CM | POA: Insufficient documentation

## 2012-08-24 LAB — CBC WITH DIFFERENTIAL/PLATELET
Eosinophils Absolute: 0 10*3/uL (ref 0.0–0.7)
Lymphocytes Relative: 25 % (ref 12–46)
Lymphs Abs: 0.6 10*3/uL — ABNORMAL LOW (ref 0.7–4.0)
Neutro Abs: 0.8 10*3/uL — ABNORMAL LOW (ref 1.7–7.7)
Neutrophils Relative %: 34 % — ABNORMAL LOW (ref 43–77)
Platelets: 54 10*3/uL — ABNORMAL LOW (ref 150–400)
RBC: 3.18 MIL/uL — ABNORMAL LOW (ref 4.22–5.81)
WBC: 2.3 10*3/uL — ABNORMAL LOW (ref 4.0–10.5)

## 2012-08-24 NOTE — Progress Notes (Signed)
Labs drawn today for cbc/diff 

## 2012-08-24 NOTE — Addendum Note (Signed)
Addended by: Corena Herter D on: 08/24/2012 03:19 PM   Modules accepted: Orders

## 2012-08-25 ENCOUNTER — Other Ambulatory Visit (HOSPITAL_COMMUNITY): Payer: Self-pay | Admitting: Oncology

## 2012-08-25 DIAGNOSIS — D469 Myelodysplastic syndrome, unspecified: Secondary | ICD-10-CM

## 2012-08-29 ENCOUNTER — Other Ambulatory Visit (HOSPITAL_COMMUNITY): Payer: Self-pay | Admitting: Oncology

## 2012-08-29 DIAGNOSIS — D469 Myelodysplastic syndrome, unspecified: Secondary | ICD-10-CM

## 2012-08-29 MED ORDER — ALPRAZOLAM 0.5 MG PO TABS: ORAL_TABLET | ORAL | Status: DC

## 2012-09-01 ENCOUNTER — Other Ambulatory Visit: Payer: Self-pay | Admitting: Radiology

## 2012-09-01 ENCOUNTER — Encounter (HOSPITAL_COMMUNITY): Payer: Self-pay | Admitting: Pharmacy Technician

## 2012-09-05 ENCOUNTER — Ambulatory Visit (HOSPITAL_COMMUNITY)
Admission: RE | Admit: 2012-09-05 | Discharge: 2012-09-05 | Disposition: A | Discharge status: Home / Self Care | Payer: Medicare Other | Source: Ambulatory Visit | Attending: Oncology | Admitting: Oncology

## 2012-09-05 ENCOUNTER — Encounter (HOSPITAL_COMMUNITY): Payer: Self-pay

## 2012-09-05 DIAGNOSIS — D696 Thrombocytopenia, unspecified: Secondary | ICD-10-CM | POA: Insufficient documentation

## 2012-09-05 DIAGNOSIS — J4489 Other specified chronic obstructive pulmonary disease: Secondary | ICD-10-CM | POA: Insufficient documentation

## 2012-09-05 DIAGNOSIS — D469 Myelodysplastic syndrome, unspecified: Secondary | ICD-10-CM

## 2012-09-05 DIAGNOSIS — Z87891 Personal history of nicotine dependence: Secondary | ICD-10-CM | POA: Insufficient documentation

## 2012-09-05 DIAGNOSIS — D61818 Other pancytopenia: Secondary | ICD-10-CM | POA: Insufficient documentation

## 2012-09-05 DIAGNOSIS — Z79899 Other long term (current) drug therapy: Secondary | ICD-10-CM | POA: Insufficient documentation

## 2012-09-05 DIAGNOSIS — J449 Chronic obstructive pulmonary disease, unspecified: Secondary | ICD-10-CM | POA: Insufficient documentation

## 2012-09-05 LAB — CBC
HCT: 34 % — ABNORMAL LOW (ref 39.0–52.0)
Hemoglobin: 11.3 g/dL — ABNORMAL LOW (ref 13.0–17.0)
MCH: 33.5 pg (ref 26.0–34.0)
MCHC: 33.2 g/dL (ref 30.0–36.0)

## 2012-09-05 MED ORDER — FENTANYL CITRATE 0.05 MG/ML IJ SOLN
INTRAMUSCULAR | Status: AC | PRN
  Administered 2012-09-05 (×3): 50 ug via INTRAVENOUS

## 2012-09-05 MED ORDER — SODIUM CHLORIDE 0.9 % IV SOLN: INTRAVENOUS | Status: DC

## 2012-09-05 MED ORDER — MIDAZOLAM HCL 2 MG/2ML IJ SOLN
INTRAMUSCULAR | Status: AC
  Filled 2012-09-05: qty 6

## 2012-09-05 MED ORDER — HYDROCODONE-ACETAMINOPHEN 5-325 MG PO TABS
1.0000 | ORAL_TABLET | ORAL | Status: DC | PRN
  Filled 2012-09-05: qty 2

## 2012-09-05 MED ORDER — MIDAZOLAM HCL 2 MG/2ML IJ SOLN
INTRAMUSCULAR | Status: AC | PRN
  Administered 2012-09-05 (×2): 1 mg via INTRAVENOUS
  Administered 2012-09-05: 0.5 mg via INTRAVENOUS

## 2012-09-05 MED ORDER — FENTANYL CITRATE 0.05 MG/ML IJ SOLN
INTRAMUSCULAR | Status: AC
  Filled 2012-09-05: qty 6

## 2012-09-05 NOTE — Procedures (Signed)
BM aspirate and core R iliac No comp

## 2012-09-05 NOTE — H&P (Signed)
Mark Morrison is an 67 y.o. male.   Chief Complaint: "I'm having a bone marrow biopsy" HPI: Patient with history of pancytopenia/worsening thrombocytopenia with MDS presents today for CT guided bone marrow biopsy.  Past Medical History  Diagnosis Date  . Ulcerative colitis 2004    with IPPA  . 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 History of DVT (deep vein thrombosis)     and PE  . Barrett's esophagus 02/2010    on EGD  . Vitamin B 12 deficiency   . BPH (benign prostatic hyperplasia)   . GERD (gastroesophageal reflux disease)   . Pulmonary nodules 04/01/2011  . Blood dyscrasia     dms,    per Dr. Leonia Corona  . COPD (chronic obstructive pulmonary disease)     emphasema,   xray done and 4 small areas found on test  . 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      Past Surgical History  Procedure Laterality Date  . Knee surgery      Left  . Abdominoperineal proctocolectomy  2004    with ileal anastomosis and anal pouch creation at [facility]  . Colonoscopy  11/22/2006    ULCERATIVE COLITIS  . Transurethral resection of prostate  02/03/2011    Procedure: TRANSURETHRAL RESECTION OF THE PROSTATE (TURP);  Surgeon: Ky Barban;  Location: [facility];  Service: Urology;  Laterality: N/A;  . Ileoscopy  03/14/2010    geographic ulceratios of the ileal pouch c/w pouchitis, more proximal distal ileum mucosa normal, biospy from ulcer benign  . Esophagogastroduodenoscopy  03/14/10    barret's esophagus, small hh, erosive duodenitis  . Colectomy    . Esophagogastroduodenoscopy  05/29/2011    SLF: Barrett's esophagus  6-7 CM SEGNMENT/ Moderate gastritis/ MILD Duodenitis/ MODERATE Hiatal hernia, Surveillance due 2016    Family History  Problem Relation Age of Onset  . Anesthesia problems Neg Hx   . Hypotension Neg Hx   . Malignant hyperthermia Neg Hx   . Pseudochol deficiency Neg Hx    Social History:   reports that he quit smoking about 17 months ago. His smoking use included Cigarettes. He has a 50 pack-year smoking history. He quit smokeless tobacco use about 16 months ago. He reports that he does not drink alcohol or use illicit drugs.  Allergies: No Known Allergies  Current outpatient prescriptions:dicyclomine (BENTYL) 10 MG capsule, Take 10-20 mg by mouth 3 (three) times daily before meals., Disp: , Rfl: ;  ferrous sulfate 325 (65 FE) MG tablet, Take 325 mg by mouth daily with breakfast., Disp: , Rfl: ;  loperamide (IMODIUM) 2 MG capsule, Take 2 mg by mouth 4 (four) times daily as needed for diarrhea or loose stools. , Disp: , Rfl:  Multiple Vitamin (MULITIVITAMIN WITH MINERALS) TABS, Take 1 tablet by mouth daily., Disp: , Rfl: ;  pantoprazole (PROTONIX) 40 MG tablet, Take 1 tablet (40 mg total) by mouth daily., Disp: 90 tablet, Rfl: 3;  vitamin C (ASCORBIC ACID) 500 MG tablet, Take 500 mg by mouth daily.  , Disp: , Rfl: ;  cyanocobalamin (,VITAMIN B-12,) 1000 MCG/ML injection, Inject 1,000 mcg into the muscle every 3 (three) months. , Disp: , Rfl:  fish oil-omega-3 fatty acids 1000 MG capsule, Take 1 g by mouth daily. , Disp: , Rfl: ;  LORazepam (ATIVAN) 0.5 MG tablet, Take 1 tablet (0.5 mg total) by mouth every 12 (twelve) hours as  needed for anxiety., Disp: 30 tablet, Rfl: 0 Current facility-administered medications:0.9 %  sodium chloride infusion, , Intravenous, Continuous, Brayton El, PA-C Facility-Administered Medications Ordered in Other Encounters: fentaNYL (SUBLIMAZE) 0.05 MG/ML injection, , , , ;  midazolam (VERSED) 2 MG/2ML injection, , , ,    Results for orders placed during the hospital encounter of 09/05/12 (from the past 48 hour(s))  CBC     Status: Abnormal   Collection Time    09/05/12  8:55 AM      Result Value Range   WBC 2.5 (*) 4.0 - 10.5 K/uL   RBC 3.37 (*) 4.22 - 5.81 MIL/uL   Hemoglobin 11.3 (*) 13.0 - 17.0 g/dL   HCT 45.4 (*) 09.8 - 11.9 %   MCV 100.9 (*) 78.0 -  100.0 fL   MCH 33.5  26.0 - 34.0 pg   MCHC 33.2  30.0 - 36.0 g/dL   RDW 14.7 (*) 82.9 - 56.2 %   Platelets 64 (*) 150 - 400 K/uL   Comment: SPECIMEN CHECKED FOR CLOTS     REPEATED TO VERIFY     PLATELET COUNT CONFIRMED BY SMEAR  PROTIME-INR     Status: None   Collection Time    09/05/12  8:55 AM      Result Value Range   Prothrombin Time 14.6  11.6 - 15.2 seconds   INR 1.16  0.00 - 1.49   No results found.  Review of Systems  Constitutional: Negative for fever and chills.  Respiratory: Negative for cough and shortness of breath.   Cardiovascular: Negative for chest pain.  Gastrointestinal: Negative for nausea and vomiting.       Occ abd pain , diarrhea  Musculoskeletal: Negative for back pain.  Neurological: Negative for headaches.    Blood pressure 107/68, pulse 92, temperature 97.8 F (36.6 C), temperature source Oral, resp. rate 18, height 5\' 7"  (1.702 m), weight 164 lb (74.39 kg), SpO2 99.00%. Physical Exam  Constitutional: He is oriented to person, place, and time. He appears well-developed and well-nourished.  Cardiovascular: Normal rate and regular rhythm.   Respiratory: Effort normal.  Few bibasilar crackles  GI: Soft. Bowel sounds are normal. There is no tenderness.  Musculoskeletal: Normal range of motion. He exhibits no edema.  Neurological: He is alert and oriented to person, place, and time.     Assessment/Plan Pt with hx of pancytopenia/worsening thrombocytopenia in setting of MDS. Plan is for CT guided bone marrow biopsy today. Details/risks of procedure d/w pt/wife with their understanding and consent.  ALLRED,D KEVIN 09/05/2012, 9:50 AM

## 2012-09-17 ENCOUNTER — Encounter (HOSPITAL_COMMUNITY): Payer: Self-pay | Admitting: *Deleted

## 2012-09-17 ENCOUNTER — Other Ambulatory Visit (HOSPITAL_COMMUNITY): Payer: Self-pay | Admitting: Oncology

## 2012-09-17 ENCOUNTER — Emergency Department (HOSPITAL_COMMUNITY)
Admission: EM | Admit: 2012-09-17 | Discharge: 2012-09-17 | Disposition: A | Discharge status: Home / Self Care | Payer: Medicare Other | Attending: Emergency Medicine | Admitting: Emergency Medicine

## 2012-09-17 DIAGNOSIS — N390 Urinary tract infection, site not specified: Secondary | ICD-10-CM | POA: Insufficient documentation

## 2012-09-17 DIAGNOSIS — D649 Anemia, unspecified: Secondary | ICD-10-CM | POA: Insufficient documentation

## 2012-09-17 DIAGNOSIS — Z87891 Personal history of nicotine dependence: Secondary | ICD-10-CM | POA: Insufficient documentation

## 2012-09-17 DIAGNOSIS — Z86718 Personal history of other venous thrombosis and embolism: Secondary | ICD-10-CM | POA: Insufficient documentation

## 2012-09-17 DIAGNOSIS — Z87448 Personal history of other diseases of urinary system: Secondary | ICD-10-CM | POA: Insufficient documentation

## 2012-09-17 DIAGNOSIS — J449 Chronic obstructive pulmonary disease, unspecified: Secondary | ICD-10-CM | POA: Insufficient documentation

## 2012-09-17 DIAGNOSIS — K219 Gastro-esophageal reflux disease without esophagitis: Secondary | ICD-10-CM | POA: Insufficient documentation

## 2012-09-17 DIAGNOSIS — Z79899 Other long term (current) drug therapy: Secondary | ICD-10-CM | POA: Insufficient documentation

## 2012-09-17 DIAGNOSIS — D696 Thrombocytopenia, unspecified: Secondary | ICD-10-CM | POA: Insufficient documentation

## 2012-09-17 DIAGNOSIS — E538 Deficiency of other specified B group vitamins: Secondary | ICD-10-CM | POA: Insufficient documentation

## 2012-09-17 DIAGNOSIS — R319 Hematuria, unspecified: Secondary | ICD-10-CM | POA: Insufficient documentation

## 2012-09-17 DIAGNOSIS — D469 Myelodysplastic syndrome, unspecified: Secondary | ICD-10-CM | POA: Insufficient documentation

## 2012-09-17 DIAGNOSIS — Z8719 Personal history of other diseases of the digestive system: Secondary | ICD-10-CM | POA: Insufficient documentation

## 2012-09-17 DIAGNOSIS — J4489 Other specified chronic obstructive pulmonary disease: Secondary | ICD-10-CM | POA: Insufficient documentation

## 2012-09-17 DIAGNOSIS — Z862 Personal history of diseases of the blood and blood-forming organs and certain disorders involving the immune mechanism: Secondary | ICD-10-CM | POA: Insufficient documentation

## 2012-09-17 LAB — BASIC METABOLIC PANEL
BUN: 14 mg/dL (ref 6–23)
CO2: 27 mEq/L (ref 19–32)
Chloride: 108 mEq/L (ref 96–112)
GFR calc non Af Amer: 74 mL/min — ABNORMAL LOW (ref >90)
Glucose, Bld: 104 mg/dL — ABNORMAL HIGH (ref 70–99)
Potassium: 3.2 mEq/L — ABNORMAL LOW (ref 3.5–5.1)

## 2012-09-17 LAB — CBC WITH DIFFERENTIAL/PLATELET
Blasts: 0 %
MCV: 100 fL (ref 78.0–100.0)
Metamyelocytes Relative: 0 %
Monocytes Absolute: 1.1 10*3/uL — ABNORMAL HIGH (ref 0.1–1.0)
Monocytes Relative: 39 % — ABNORMAL HIGH (ref 3–12)
Platelets: 48 10*3/uL — ABNORMAL LOW (ref 150–400)
Promyelocytes Absolute: 0 %
RDW: 16.1 % — ABNORMAL HIGH (ref 11.5–15.5)
WBC: 2.7 10*3/uL — ABNORMAL LOW (ref 4.0–10.5)
nRBC: 0 /100 WBC

## 2012-09-17 LAB — URINALYSIS, ROUTINE W REFLEX MICROSCOPIC
Glucose, UA: 100 mg/dL — AB
pH: 7 (ref 5.0–8.0)

## 2012-09-17 LAB — URINE MICROSCOPIC-ADD ON

## 2012-09-17 MED ORDER — CEPHALEXIN 500 MG PO CAPS: 500.0000 mg | ORAL_CAPSULE | Freq: Three times a day (TID) | ORAL | Status: DC

## 2012-09-17 MED ORDER — CEPHALEXIN 500 MG PO CAPS
500.0000 mg | ORAL_CAPSULE | Freq: Once | ORAL | Status: AC
  Administered 2012-09-17: 500 mg via ORAL
  Filled 2012-09-17: qty 1

## 2012-09-17 NOTE — ED Notes (Signed)
Pt presents with gross hematuria that began this morning at 10 am. Pt denies pain, fever and urinary frequency at this time. NAD noted at this time.

## 2012-09-17 NOTE — ED Provider Notes (Addendum)
CSN: [id number]     Arrival date & time 09/17/12  1641 History  This chart was scribed for Ward Givens, MD, by Yevette Edwards, ED Scribe. This patient was seen in room APA06/APA06 and the patient's care was started at 5:27 PM.   First MD Initiated Contact with Patient 09/17/12 1658     Chief Complaint  Patient presents with  . Hematuria    The history is provided by the patient. No language interpreter was used.   HPI Comments: Mark Morrison is a 67 y.o. male, with a h/o of blood dyscrasia ( myelodysplastic syndrome) and benign prostatic hyperplasia, who presents to the Emergency Department complaining of intermittent hematuria which has been occurring for the past six months, but it became worse approximately 10 am this morning. The pt states that he "peeing blood", and that he has experienced 5 episodes of gross hematuria today. He denies experiencing any pain, but he states that when the urine won't come out any more, it does "hurt a little bit."  The pt states that he has been drinking more water since the hematuria began. He denies experiencing any fever, frequency, dysuria, abdominal pain, nausea, or emesis. The pt also denies a h/o of blood thinners and any h/o of chemotherapy. 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   The pt's urologist Dr. Jerre Simon. He visited Dr. Jerre Simon due to difficulty urinating and had a large prostate. He underwent TURP.  Dr. [name] is PCP.    Past Medical History  Diagnosis Date  . Ulcerative colitis 2004    with IPPA  . 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 History of DVT (deep vein thrombosis)     and PE  . Barrett's esophagus 02/2010    on EGD  . Vitamin B 12 deficiency   . BPH (benign prostatic hyperplasia)   . GERD (gastroesophageal reflux disease)   . Pulmonary nodules 04/01/2011  . Blood dyscrasia     dms,    per Dr. Leonia Corona  . COPD (chronic obstructive pulmonary disease)     emphasema,   xray done  and 4 small areas found on test  . 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     Past Surgical History  Procedure Laterality Date  . Knee surgery      Left  . Abdominoperineal proctocolectomy  2004    with ileal anastomosis and anal pouch creation at [facility]  . Colonoscopy  11/22/2006    ULCERATIVE COLITIS  . Transurethral resection of prostate  02/03/2011    Procedure: TRANSURETHRAL RESECTION OF THE PROSTATE (TURP);  Surgeon: Ky Barban;  Location: [facility];  Service: Urology;  Laterality: N/A;  . Ileoscopy  03/14/2010    geographic ulceratios of the ileal pouch c/w pouchitis, more proximal distal ileum mucosa normal, biospy from ulcer benign  . Esophagogastroduodenoscopy  03/14/10    barret's esophagus, small hh, erosive duodenitis  . Colectomy    . Esophagogastroduodenoscopy  05/29/2011    SLF: Barrett's esophagus  6-7 CM SEGNMENT/ Moderate gastritis/ MILD Duodenitis/ MODERATE Hiatal hernia, Surveillance due 2016   Family History  Problem Relation Age of Onset  . Anesthesia problems Neg Hx   . Hypotension Neg Hx   . Malignant hyperthermia Neg Hx   . Pseudochol deficiency Neg Hx    History  Substance Use Topics  . Smoking status: Former Smoker --  1.00 packs/day for 50 years    Types: Cigarettes    Quit date: 03/23/2011  . Smokeless tobacco: Former Neurosurgeon    Quit date: 04/12/2011  . Alcohol Use: No  lives at home Lives with spouse  Review of Systems  Constitutional: Negative for fever and chills.  Gastrointestinal: Negative for nausea, vomiting and abdominal pain.  Genitourinary: Positive for hematuria. Negative for dysuria, frequency and difficulty urinating.  All other systems reviewed and are negative.    Allergies  Review of patient's allergies indicates no known allergies.  Home Medications   Current Outpatient Rx  Name  Route  Sig  Dispense  Refill  . cyanocobalamin (,VITAMIN  B-12,) 1000 MCG/ML injection   Intramuscular   Inject 1,000 mcg into the muscle every 3 (three) months.          . dicyclomine (BENTYL) 10 MG capsule   Oral   Take 10-20 mg by mouth 3 (three) times daily before meals.         . ferrous sulfate 325 (65 FE) MG tablet   Oral   Take 325 mg by mouth daily with breakfast.         . fish oil-omega-3 fatty acids 1000 MG capsule   Oral   Take 1 g by mouth daily.          Marland Kitchen loperamide (IMODIUM) 2 MG capsule   Oral   Take 2 mg by mouth 4 (four) times daily as needed for diarrhea or loose stools.          Marland Kitchen LORazepam (ATIVAN) 0.5 MG tablet   Oral   Take 1 tablet (0.5 mg total) by mouth every 12 (twelve) hours as needed for anxiety.   30 tablet   0     Future refills from Primary Care Physician   . Multiple Vitamin (MULITIVITAMIN WITH MINERALS) TABS   Oral   Take 1 tablet by mouth daily.         . pantoprazole (PROTONIX) 40 MG tablet   Oral   Take 1 tablet (40 mg total) by mouth daily.   90 tablet   3   . vitamin C (ASCORBIC ACID) 500 MG tablet   Oral   Take 500 mg by mouth daily.            Triage Vitals: BP 114/70  Pulse 109  Temp(Src) 97.6 F (36.4 C) (Oral)  Resp 16  SpO2 98%  Vital signs normal    Physical Exam  Nursing note and vitals reviewed. Constitutional: He is oriented to person, place, and time. He appears well-developed and well-nourished.  Non-toxic appearance. He does not appear ill. No distress.  HENT:  Head: Normocephalic and atraumatic.  Right Ear: External ear normal.  Left Ear: External ear normal.  Nose: Nose normal. No mucosal edema or rhinorrhea.  Mouth/Throat: Oropharynx is clear and moist and mucous membranes are normal. No dental abscesses or edematous.  Eyes: Conjunctivae and EOM are normal. Pupils are equal, round, and reactive to light.  Neck: Normal range of motion and full passive range of motion without pain. Neck supple.  Cardiovascular: Normal rate, regular rhythm and  normal heart sounds.  Exam reveals no gallop and no friction rub.   No murmur heard. Pulmonary/Chest: Effort normal and breath sounds normal. No respiratory distress. He has no wheezes. He has no rhonchi. He has no rales. He exhibits no tenderness and no crepitus.  Abdominal: Soft. Normal appearance and bowel sounds are normal. He exhibits no  distension. There is no tenderness. There is no rebound and no guarding.  Musculoskeletal: Normal range of motion. He exhibits no edema and no tenderness.  Moves all extremities well.   Neurological: He is alert and oriented to person, place, and time. He has normal strength. No cranial nerve deficit.  Skin: Skin is warm, dry and intact. No rash noted. No erythema. No pallor.  Psychiatric: He has a normal mood and affect. His speech is normal and behavior is normal. His mood appears not anxious.    ED Course   Medications  cephALEXin (KEFLEX) capsule 500 mg (500 mg Oral Given 09/17/12 1908)    DIAGNOSTIC STUDIES: Oxygen Saturation is 98% on room air, normal by my interpretation.    COORDINATION OF CARE:  5:33 PM- Discussed treatment plan with patient including results of the UA, and the patient agreed to the plan.   Review of old records show patient did have a cystoscopy and TURP done in December 2012. At that time there is no mention of a bladder tumor.  Patient also had bone marrow aspiration done on July 15 which did not show any significant blastic population.  18:47 PM Dr Arlice Colt, oncology/hematology, feels if patient is reliable he can be discharged to return if he feels worse, have seen urology and call hematology on Monday, July 28th.   Procedures (including critical care time)  Results for orders placed during the hospital encounter of 09/17/12  URINALYSIS, ROUTINE W REFLEX MICROSCOPIC      Result Value Range   Color, Urine RED (*) YELLOW   APPearance HAZY (*) CLEAR   Specific Gravity, Urine 1.015  1.005 - 1.030   pH 7.0  5.0 -  8.0   Glucose, UA 100 (*) NEGATIVE mg/dL   Hgb urine dipstick LARGE (*) NEGATIVE   Bilirubin Urine NEGATIVE  NEGATIVE   Ketones, ur 15 (*) NEGATIVE mg/dL   Protein, ur >191 (*) NEGATIVE mg/dL   Urobilinogen, UA 4.0 (*) 0.0 - 1.0 mg/dL   Nitrite POSITIVE (*) NEGATIVE   Leukocytes, UA LARGE (*) NEGATIVE  URINE MICROSCOPIC-ADD ON      Result Value Range   WBC, UA TOO NUMEROUS TO COUNT  <3 WBC/hpf   RBC / HPF TOO NUMEROUS TO COUNT  <3 RBC/hpf   Bacteria, UA MANY (*) RARE  CBC WITH DIFFERENTIAL      Result Value Range   WBC 2.7 (*) 4.0 - 10.5 K/uL   RBC 2.69 (*) 4.22 - 5.81 MIL/uL   Hemoglobin 9.3 (*) 13.0 - 17.0 g/dL   HCT 47.8 (*) 29.5 - 62.1 %   MCV 100.0  78.0 - 100.0 fL   MCH 34.6 (*) 26.0 - 34.0 pg   MCHC 34.6  30.0 - 36.0 g/dL   RDW 30.8 (*) 65.7 - 84.6 %   Platelets 48 (*) 150 - 400 K/uL   Neutrophils Relative % 43  43 - 77 %   Lymphocytes Relative 18  12 - 46 %   Monocytes Relative 39 (*) 3 - 12 %   Eosinophils Relative 0  0 - 5 %   Basophils Relative 0  0 - 1 %   Band Neutrophils 0  0 - 10 %   Metamyelocytes Relative 0     Myelocytes 0     Promyelocytes Absolute 0     Blasts 0     nRBC 0  0 /100 WBC   Neutro Abs 1.1 (*) 1.7 - 7.7 K/uL   Lymphs Abs 0.5 (*)  0.7 - 4.0 K/uL   Monocytes Absolute 1.1 (*) 0.1 - 1.0 K/uL   Eosinophils Absolute 0.0  0.0 - 0.7 K/uL   Basophils Absolute 0.0  0.0 - 0.1 K/uL  BASIC METABOLIC PANEL      Result Value Range   Sodium 126 (*) 135 - 145 mEq/L   Potassium 3.2 (*) 3.5 - 5.1 mEq/L   Chloride 108  96 - 112 mEq/L   CO2 27  19 - 32 mEq/L   Glucose, Bld 104 (*) 70 - 99 mg/dL   BUN 14  6 - 23 mg/dL   Creatinine, Ser 3.08  0.50 - 1.35 mg/dL   Calcium 8.6  8.4 - 65.7 mg/dL   GFR calc non Af Amer 74 (*) >90 mL/min   GFR calc Af Amer 86 (*) >90 mL/min   Laboratory interpretation all normal except mild hyponatremia and low potassium, some worsening of his baseline thrombocytopenia, his hemoglobin has dropped from 11-9.3 PT has been in the  nines before in the past couple months. Stable total white blood cell count low without neutropenia, UTI with hematuria    1. Hematuria   2. UTI (urinary tract infection)   3. Myelodysplastic syndrome   4. Thrombocytopenia   5. Anemia    Discharge Medication List as of 09/17/2012  6:56 PM    START taking these medications   Details  cephALEXin (KEFLEX) 500 MG capsule Take 1 capsule (500 mg total) by mouth 3 (three) times daily., Starting 09/17/2012, Until Discontinued, Print        Plan discharge   Devoria Albe, MD, FACEP   MDM    I personally performed the services described in this documentation, which was scribed in my presence. The recorded information has been reviewed and considered.     Ward Givens, MD 09/17/12 8469  Ward Givens, MD 09/17/12 2042

## 2012-09-17 NOTE — ED Notes (Signed)
Pt states that he has noticed blood in his urine on and off for about 6 months, this am the bleeding became worse. Denies any pain, denies any use of blood thinners,

## 2012-09-19 ENCOUNTER — Telehealth (HOSPITAL_COMMUNITY): Payer: Self-pay

## 2012-09-19 LAB — URINE CULTURE

## 2012-09-19 NOTE — Telephone Encounter (Signed)
Call from wife - stated "I had to take Mark Morrison to the ED on Saturday.  He had blood in his urine and they told us he had a urinary tract infection and was put on an antibiotic.  They told me to call Mark Morrison and let him know about the visit and that Mark Morrison's hemoglobin was 9.3 and his platelet count was 48,000.  Just wanted to let you know what was going on and to see if we needed to do anything else.  He seems to be doing better today."  Can be reached at [phone].

## 2012-09-19 NOTE — Telephone Encounter (Signed)
Patient did the right thing by reporting to ED.  He was diagnosed with a UTI which is likely the source of his hematuria.  His platelet count is nearly 50,000 and most patient do not bleed from this platelet count.  If hematuria continues, I would refer him to urology for evaluation.  His bone aspiration and biopsy was solid.  He is not in need of hematologic therapy at this time.

## 2012-09-20 ENCOUNTER — Other Ambulatory Visit (HOSPITAL_COMMUNITY): Payer: Self-pay | Admitting: Urology

## 2012-09-20 DIAGNOSIS — R310 Gross hematuria: Secondary | ICD-10-CM

## 2012-09-23 ENCOUNTER — Encounter (HOSPITAL_COMMUNITY): Payer: Self-pay

## 2012-09-23 ENCOUNTER — Ambulatory Visit (HOSPITAL_COMMUNITY)
Admission: RE | Admit: 2012-09-23 | Discharge: 2012-09-23 | Disposition: A | Discharge status: Home / Self Care | Payer: Medicare Other | Source: Ambulatory Visit | Attending: Urology | Admitting: Urology

## 2012-09-23 DIAGNOSIS — R310 Gross hematuria: Secondary | ICD-10-CM

## 2012-09-23 DIAGNOSIS — Q619 Cystic kidney disease, unspecified: Secondary | ICD-10-CM | POA: Insufficient documentation

## 2012-09-23 DIAGNOSIS — K8020 Calculus of gallbladder without cholecystitis without obstruction: Secondary | ICD-10-CM | POA: Insufficient documentation

## 2012-09-23 DIAGNOSIS — K449 Diaphragmatic hernia without obstruction or gangrene: Secondary | ICD-10-CM | POA: Insufficient documentation

## 2012-09-23 MED ORDER — IOHEXOL 300 MG/ML  SOLN
125.0000 mL | Freq: Once | INTRAMUSCULAR | Status: AC | PRN
  Administered 2012-09-23: 125 mL via INTRAVENOUS

## 2012-10-11 ENCOUNTER — Other Ambulatory Visit: Payer: Self-pay | Admitting: Gastroenterology

## 2012-10-11 ENCOUNTER — Encounter: Payer: Self-pay | Admitting: Gastroenterology

## 2012-10-11 ENCOUNTER — Ambulatory Visit (INDEPENDENT_AMBULATORY_CARE_PROVIDER_SITE_OTHER): Payer: Medicare Other | Admitting: Gastroenterology

## 2012-10-11 VITALS — BP 104/61 | HR 106 | Temp 97.4°F | Ht 67.0 in | Wt 159.4 lb

## 2012-10-11 DIAGNOSIS — R195 Other fecal abnormalities: Secondary | ICD-10-CM

## 2012-10-11 DIAGNOSIS — K2270 Barrett's esophagus without dysplasia: Secondary | ICD-10-CM

## 2012-10-11 MED ORDER — PANTOPRAZOLE SODIUM 40 MG PO TBEC: 40.0000 mg | DELAYED_RELEASE_TABLET | Freq: Every day | ORAL | Status: DC

## 2012-10-11 NOTE — Patient Instructions (Addendum)
We have scheduled you for a flexible sigmoidoscopy with Dr. Darrick Penna in the near future.  Do not pick up the Questran. I think you are doing well right now without it.   I have sent Protonix to the mail order pharmacy.

## 2012-10-11 NOTE — Progress Notes (Signed)
Referring Provider: Londell Moh,* Primary Care Physician:  Londell Moh, MD  Chief Complaint  Patient presents with  . Heme +    HPI:   He has history of ulcerative colitis status post proctocolectomy with ileal anastomosis and anal pouch creation and history of recurrent pouchitis. Also hx notable for Barrett's, with last EGD in April 2013. Needs surveillance in April 2016. He has required multiple rounds of abx due to recurrent pouchitis. Per our records, he was referred to [facility] for further evaluation in June 2013. Appears  HBT and flex sig at [facility] in the past was offered but never completed.    Past Medical History  Diagnosis Date  . Ulcerative colitis 2004    with IPPA  . 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 History of DVT (deep vein thrombosis)     and PE  . Barrett's esophagus 02/2010    on EGD  . Vitamin B 12 deficiency   . BPH (benign prostatic hyperplasia)   . GERD (gastroesophageal reflux disease)   . Pulmonary nodules 04/01/2011  . Blood dyscrasia     dms,    per Dr. Leonia Corona  . COPD (chronic obstructive pulmonary disease)     emphasema,   xray done and 4 small areas found on test  . 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      Past Surgical History  Procedure Laterality Date  . Knee surgery      Left  . Abdominoperineal proctocolectomy  2004    with ileal anastomosis and anal pouch creation at [facility]  . Colonoscopy  11/22/2006    ULCERATIVE COLITIS  . Transurethral resection of prostate  02/03/2011    Procedure: TRANSURETHRAL RESECTION OF THE PROSTATE (TURP);  Surgeon: Ky Barban;  Location: [facility];  Service: Urology;  Laterality: N/A;  . Ileoscopy  03/14/2010    geographic ulceratios of the ileal pouch c/w pouchitis, more proximal distal ileum mucosa normal, biospy from ulcer benign  . Esophagogastroduodenoscopy  03/14/10    barret's  esophagus, small hh, erosive duodenitis  . Colectomy    . Esophagogastroduodenoscopy  05/29/2011    SLF: Barrett's esophagus  6-7 CM SEGNMENT/ Moderate gastritis/ MILD Duodenitis/ MODERATE Hiatal hernia, Surveillance due 2016    Current Outpatient Prescriptions  Medication Sig Dispense Refill  . cyanocobalamin (,VITAMIN B-12,) 1000 MCG/ML injection Inject 1,000 mcg into the muscle every 3 (three) months.       . dicyclomine (BENTYL) 10 MG capsule Take 10-20 mg by mouth 3 (three) times daily before meals.      . ferrous sulfate 325 (65 FE) MG tablet Take 325 mg by mouth daily after lunch.       . loperamide (IMODIUM) 2 MG capsule Take 2 mg by mouth 4 (four) times daily as needed for diarrhea or loose stools.       Marland Kitchen LORazepam (ATIVAN) 0.5 MG tablet Take 1 tablet (0.5 mg total) by mouth every 12 (twelve) hours as needed for anxiety.  30 tablet  0  . Multiple Vitamin (MULITIVITAMIN WITH MINERALS) TABS Take 1 tablet by mouth daily.      . pantoprazole (PROTONIX) 40 MG tablet Take 1 tablet (40 mg total) by mouth daily.  90 tablet  3  . vitamin C (ASCORBIC ACID) 500 MG tablet Take 500 mg by mouth daily.        . cephALEXin (KEFLEX) 500 MG capsule  Take 1 capsule (500 mg total) by mouth 3 (three) times daily.  30 capsule  0   No current facility-administered medications for this visit.    Allergies as of 10/11/2012  . (No Known Allergies)    Family History  Problem Relation Age of Onset  . Anesthesia problems Neg Hx   . Hypotension Neg Hx   . Malignant hyperthermia Neg Hx   . Pseudochol deficiency Neg Hx     History   Social History  . Marital Status: Married    Spouse Name: N/A    Number of Children: 2  . Years of Education: N/A   Occupational History  . retired, Therapist, sports, Psychologist, occupational    Social History Main Topics  . Smoking status: Former Smoker -- 1.00 packs/day for 50 years    Types: Cigarettes    Quit date: 03/23/2011  . Smokeless tobacco: Former Neurosurgeon    Quit date:  04/12/2011  . Alcohol Use: No  . Drug Use: No  . Sexual Activity: Not Currently   Other Topics Concern  . None   Social History Narrative  . None    Review of Systems: Gen: Denies fever, chills, anorexia. Denies fatigue, weakness, weight loss.  CV: Denies chest pain, palpitations, syncope, peripheral edema, and claudication. Resp: Denies dyspnea at rest, cough, wheezing, coughing up blood, and pleurisy. GI: Denies vomiting blood, jaundice, and fecal incontinence.   Denies dysphagia or odynophagia. Derm: Denies rash, itching, dry skin Psych: Denies depression, anxiety, memory loss, confusion. No homicidal or suicidal ideation.  Heme: Denies bruising, bleeding, and enlarged lymph nodes.  Physical Exam: BP 104/61  Pulse 106  Temp(Src) 97.4 F (36.3 C) (Oral)  Ht 5\' 7"  (1.702 m)  Wt 159 lb 6.4 oz (72.303 kg)  BMI 24.96 kg/m2 General:   Alert and oriented. No distress noted. Pleasant and cooperative.  Head:  Normocephalic and atraumatic. Eyes:  Conjuctiva clear without scleral icterus. Mouth:  Oral mucosa pink and moist. Good dentition. No lesions. Neck:  Supple, without mass or thyromegaly. Heart:  S1, S2 present without murmurs, rubs, or gallops. Regular rate and rhythm. Abdomen:  +BS, soft, non-tender and non-distended. No rebound or guarding. No HSM or masses noted. Msk:  Symmetrical without gross deformities. Normal posture. Pulses:  2+ DP noted bilaterally Extremities:  Without edema. Neurologic:  Alert and  oriented x4;  grossly normal neurologically. Skin:  Intact without significant lesions or rashes. Cervical Nodes:  No significant cervical adenopathy. Psych:  Alert and cooperative. Normal mood and affect.

## 2012-10-13 DIAGNOSIS — R195 Other fecal abnormalities: Secondary | ICD-10-CM | POA: Insufficient documentation

## 2012-10-13 NOTE — Assessment & Plan Note (Signed)
Due for surveillance in April 2016.

## 2012-10-13 NOTE — Progress Notes (Signed)
Referring Provider: Londell Morrison,* Primary Care Physician:  Mark Moh, MD Primary GI: Dr. Darrick Penna   Chief Complaint  Patient presents with  . Heme +    HPI:   Mark Morrison presents today at the request of his PCP due to heme positive stool and abnormal rectal exam.  He has history of ulcerative colitis status post proctocolectomy with ileal anastomosis and anal pouch creation and history of recurrent pouchitis. Also hx notable for Barrett's, with last EGD in April 2013. Needs surveillance in April 2016. He has required multiple rounds of abx due to recurrent pouchitis. Per our records, he was referred to [facility] for further evaluation in June 2013. Appears  HBT and flex sig at [facility] in the past was offered but never completed.  Returns today noting he has 2-3 soft BMs per day. No rectal bleeding or abdominal pain. Good appetite. Denies any other upper GI symptoms or concerns.   Past Medical History  Diagnosis Date  . Ulcerative colitis 2004    with IPPA  . 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 History of DVT (deep vein thrombosis)     and PE  . Barrett's esophagus 02/2010    on EGD  . Vitamin B 12 deficiency   . BPH (benign prostatic hyperplasia)   . GERD (gastroesophageal reflux disease)   . Pulmonary nodules 04/01/2011  . Blood dyscrasia     dms,    per Dr. Leonia Corona  . COPD (chronic obstructive pulmonary disease)     emphasema,   xray done and 4 small areas found on test  . 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      Past Surgical History  Procedure Laterality Date  . Knee surgery      Left  . Abdominoperineal proctocolectomy  2004    with ileal anastomosis and anal pouch creation at [facility]  . Colonoscopy  11/22/2006    ULCERATIVE COLITIS  . Transurethral resection of prostate  02/03/2011    Procedure: TRANSURETHRAL RESECTION OF THE PROSTATE (TURP);  Surgeon:  Ky Barban;  Location: [facility];  Service: Urology;  Laterality: N/A;  . Ileoscopy  03/14/2010    geographic ulceratios of the ileal pouch c/w pouchitis, more proximal distal ileum mucosa normal, biospy from ulcer benign  . Esophagogastroduodenoscopy  03/14/10    barret's esophagus, small hh, erosive duodenitis  . Colectomy    . Esophagogastroduodenoscopy  05/29/2011    SLF: Barrett's esophagus  6-7 CM SEGNMENT/ Moderate gastritis/ MILD Duodenitis/ MODERATE Hiatal hernia, Surveillance due 2016    Current Outpatient Prescriptions  Medication Sig Dispense Refill  . cyanocobalamin (,VITAMIN B-12,) 1000 MCG/ML injection Inject 1,000 mcg into the muscle every 3 (three) months.       . dicyclomine (BENTYL) 10 MG capsule Take 10-20 mg by mouth 3 (three) times daily before meals.      . ferrous sulfate 325 (65 FE) MG tablet Take 325 mg by mouth daily after lunch.       . loperamide (IMODIUM) 2 MG capsule Take 2 mg by mouth 4 (four) times daily as needed for diarrhea or loose stools.       Marland Kitchen LORazepam (ATIVAN) 0.5 MG tablet Take 1 tablet (0.5 mg total) by mouth every 12 (twelve) hours as needed for anxiety.  30 tablet  0  . Multiple Vitamin (MULITIVITAMIN WITH MINERALS) TABS Take 1 tablet by mouth daily.      Marland Kitchen  pantoprazole (PROTONIX) 40 MG tablet Take 1 tablet (40 mg total) by mouth daily.  90 tablet  3  . vitamin C (ASCORBIC ACID) 500 MG tablet Take 500 mg by mouth daily.         No current facility-administered medications for this visit.    Allergies as of 10/11/2012  . (No Known Allergies)    Family History  Problem Relation Age of Onset  . Anesthesia problems Neg Hx   . Hypotension Neg Hx   . Malignant hyperthermia Neg Hx   . Pseudochol deficiency Neg Hx     History   Social History  . Marital Status: Married    Spouse Name: N/A    Number of Children: 2  . Years of Education: N/A   Occupational History  . retired, Therapist, sports, Psychologist, occupational    Social History Main Topics  .  Smoking status: Former Smoker -- 1.00 packs/day for 50 years    Types: Cigarettes    Quit date: 03/23/2011  . Smokeless tobacco: Former Neurosurgeon    Quit date: 04/12/2011  . Alcohol Use: No  . Drug Use: No  . Sexual Activity: Not Currently   Other Topics Concern  . None   Social History Narrative  . None    Review of Systems: Negative unless mentioned in HPI  Physical Exam: BP 104/61  Pulse 106  Temp(Src) 97.4 F (36.3 C) (Oral)  Ht 5\' 7"  (1.702 m)  Wt 159 lb 6.4 oz (72.303 kg)  BMI 24.96 kg/m2 General:   Alert and oriented. No distress noted. Thin.  Head:  Normocephalic and atraumatic. Eyes:  Conjuctiva clear without scleral icterus. Mouth:  Oral mucosa pink and moist. Good dentition. No lesions. Heart:  S1, S2 present without murmurs, rubs, or gallops. Regular rate and rhythm. Abdomen:  +BS, soft, non-tender and non-distended. Ventral hernia. 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  Msk:  Symmetrical without gross deformities. Normal posture. Extremities:  Without edema. Neurologic:  Alert and  oriented x4;  grossly normal neurologically. Skin:  Intact without significant lesions or rashes. Psych:  Alert and cooperative. Normal mood and affect.

## 2012-10-13 NOTE — Assessment & Plan Note (Signed)
67 year old male with a history of UC s/p proctocolectomy with ileal anastomosis and anal pouch creation in 2004, with heme positive stool through PCP. 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 On digital exam today, possible patent stricture noted on rectal exam; last ileoscopy Jan 2012. With his recurrent pouchitis and prior issues with diarrhea, [facility] had actually recommended a flex sig, which had not been performed to date. Also of note, Hgb dropped from 11 range to 9 in a months' time. No overt signs of GI bleeding.   Recommend ileoscopy for further revaluation of anatomy and culprit for heme positive stool. Likely this is benign in nature, and I question if "stricture" is secondary to post-surgical anatomy. Patient has no difficulty with bowel movements currently and is actually doing well with 2-3 soft bowel movements a day.   Proceed with ileoscopy with Dr. Darrick Penna in the near future. The risks, benefits, and alternatives have been discussed in detail with the patient. They state understanding and desire to proceed.  Hold off on Questran, which was prescribed from PCP

## 2012-10-14 NOTE — Progress Notes (Signed)
CC'd to PCP 

## 2012-10-17 ENCOUNTER — Other Ambulatory Visit (HOSPITAL_COMMUNITY): Payer: Self-pay | Admitting: Oncology

## 2012-10-17 DIAGNOSIS — D469 Myelodysplastic syndrome, unspecified: Secondary | ICD-10-CM

## 2012-10-25 ENCOUNTER — Encounter (HOSPITAL_COMMUNITY): Payer: Medicare Other | Attending: Oncology

## 2012-10-25 DIAGNOSIS — D649 Anemia, unspecified: Secondary | ICD-10-CM | POA: Insufficient documentation

## 2012-10-25 DIAGNOSIS — D469 Myelodysplastic syndrome, unspecified: Secondary | ICD-10-CM | POA: Insufficient documentation

## 2012-10-25 LAB — CBC WITH DIFFERENTIAL/PLATELET
Basophils Absolute: 0 10*3/uL (ref 0.0–0.1)
Eosinophils Relative: 0 % (ref 0–5)
HCT: 25.4 % — ABNORMAL LOW (ref 39.0–52.0)
Lymphocytes Relative: 22 % (ref 12–46)
MCV: 107.2 fL — ABNORMAL HIGH (ref 78.0–100.0)
Monocytes Absolute: 0.6 10*3/uL (ref 0.1–1.0)
RDW: 18.5 % — ABNORMAL HIGH (ref 11.5–15.5)
WBC: 2.1 10*3/uL — ABNORMAL LOW (ref 4.0–10.5)

## 2012-10-25 LAB — RETICULOCYTES
RBC.: 2.37 MIL/uL — ABNORMAL LOW (ref 4.22–5.81)
Retic Count, Absolute: 151.7 10*3/uL (ref 19.0–186.0)

## 2012-10-25 NOTE — Progress Notes (Signed)
Mark Morrison's reason for visit today are for labs as scheduled per MD orders.  Venipuncture performed with a 23 gauge butterfly needle to L Antecubital.  Mark Morrison tolerated venipuncture well and without incident; questions were answered and patient was discharged.

## 2012-10-26 ENCOUNTER — Other Ambulatory Visit (HOSPITAL_COMMUNITY): Payer: Self-pay | Admitting: Oncology

## 2012-11-02 ENCOUNTER — Ambulatory Visit (HOSPITAL_COMMUNITY): Payer: Medicare Other

## 2012-11-02 ENCOUNTER — Encounter (HOSPITAL_COMMUNITY): Payer: Self-pay | Admitting: Pharmacy Technician

## 2012-11-07 ENCOUNTER — Encounter: Payer: Self-pay | Admitting: Internal Medicine

## 2012-11-09 ENCOUNTER — Other Ambulatory Visit (HOSPITAL_COMMUNITY): Payer: Self-pay | Admitting: Oncology

## 2012-11-09 ENCOUNTER — Encounter (HOSPITAL_BASED_OUTPATIENT_CLINIC_OR_DEPARTMENT_OTHER): Payer: Medicare Other

## 2012-11-09 VITALS — BP 110/72 | HR 84

## 2012-11-09 DIAGNOSIS — D469 Myelodysplastic syndrome, unspecified: Secondary | ICD-10-CM

## 2012-11-09 LAB — CBC
Hemoglobin: 6.9 g/dL — CL (ref 13.0–17.0)
MCH: 38.8 pg — ABNORMAL HIGH (ref 26.0–34.0)
RBC: 1.78 MIL/uL — ABNORMAL LOW (ref 4.22–5.81)
WBC: 2.2 10*3/uL — ABNORMAL LOW (ref 4.0–10.5)

## 2012-11-09 MED ORDER — DARBEPOETIN ALFA-POLYSORBATE 300 MCG/0.6ML IJ SOLN
300.0000 ug | Freq: Once | INTRAMUSCULAR | Status: AC
  Administered 2012-11-09: 300 ug via SUBCUTANEOUS
  Filled 2012-11-09: qty 0.6

## 2012-11-09 NOTE — Progress Notes (Signed)
[place] obtained for lab tests from left arm.  Tolerated well.

## 2012-11-10 NOTE — Progress Notes (Signed)
Pt had critical hgb of 6.9.  Samuella Bruin PA notified.  Contacted pt to explain low hgb and to inform that he could come in on thurs to get transfused. Pt refused, Stating that he felt "altight".  Instructed wife to get in touch with [place] if he changes his mind.

## 2012-11-11 ENCOUNTER — Ambulatory Visit (HOSPITAL_COMMUNITY)
Admission: RE | Admit: 2012-11-11 | Discharge: 2012-11-11 | Disposition: A | Discharge status: Home / Self Care | Payer: Medicare Other | Source: Ambulatory Visit | Attending: Gastroenterology | Admitting: Gastroenterology

## 2012-11-11 ENCOUNTER — Encounter (HOSPITAL_COMMUNITY)
Admission: RE | Disposition: A | Discharge status: Home / Self Care | Payer: Self-pay | Source: Ambulatory Visit | Attending: Gastroenterology

## 2012-11-11 ENCOUNTER — Encounter (HOSPITAL_COMMUNITY): Payer: Self-pay | Admitting: *Deleted

## 2012-11-11 DIAGNOSIS — R195 Other fecal abnormalities: Secondary | ICD-10-CM

## 2012-11-11 DIAGNOSIS — K5190 Ulcerative colitis, unspecified, without complications: Secondary | ICD-10-CM | POA: Insufficient documentation

## 2012-11-11 DIAGNOSIS — K624 Stenosis of anus and rectum: Secondary | ICD-10-CM

## 2012-11-11 DIAGNOSIS — K921 Melena: Secondary | ICD-10-CM | POA: Insufficient documentation

## 2012-11-11 DIAGNOSIS — K633 Ulcer of intestine: Secondary | ICD-10-CM

## 2012-11-11 DIAGNOSIS — J449 Chronic obstructive pulmonary disease, unspecified: Secondary | ICD-10-CM | POA: Insufficient documentation

## 2012-11-11 DIAGNOSIS — J4489 Other specified chronic obstructive pulmonary disease: Secondary | ICD-10-CM | POA: Insufficient documentation

## 2012-11-11 HISTORY — DX: Shortness of breath: R06.02

## 2012-11-11 HISTORY — PX: FLEXIBLE SIGMOIDOSCOPY: SHX5431

## 2012-11-11 HISTORY — DX: Anxiety disorder, unspecified: F41.9

## 2012-11-11 SURGERY — SIGMOIDOSCOPY, FLEXIBLE
Anesthesia: Moderate Sedation

## 2012-11-11 MED ORDER — SODIUM CHLORIDE 0.9 % IV SOLN
INTRAVENOUS | Status: DC
  Administered 2012-11-11: 08:00:00 via INTRAVENOUS

## 2012-11-11 MED ORDER — MEPERIDINE HCL 100 MG/ML IJ SOLN
INTRAMUSCULAR | Status: AC
  Filled 2012-11-11: qty 1

## 2012-11-11 MED ORDER — STERILE WATER FOR IRRIGATION IR SOLN
Status: DC | PRN
  Administered 2012-11-11: 09:00:00

## 2012-11-11 MED ORDER — MIDAZOLAM HCL 5 MG/5ML IJ SOLN
INTRAMUSCULAR | Status: DC | PRN
  Administered 2012-11-11: 1 mg via INTRAVENOUS
  Administered 2012-11-11: 2 mg via INTRAVENOUS
  Administered 2012-11-11: 1 mg via INTRAVENOUS
  Administered 2012-11-11: 2 mg via INTRAVENOUS

## 2012-11-11 MED ORDER — MIDAZOLAM HCL 5 MG/5ML IJ SOLN
INTRAMUSCULAR | Status: AC
  Filled 2012-11-11: qty 10

## 2012-11-11 MED ORDER — MEPERIDINE HCL 100 MG/ML IJ SOLN
INTRAMUSCULAR | Status: DC | PRN
  Administered 2012-11-11 (×2): 25 mg via INTRAVENOUS

## 2012-11-11 NOTE — Op Note (Signed)
[facility] [address] [place], [address]   FLEX SIGMOIDOSCOPY PROCEDURE REPORT  PATIENT: Mark Morrison, Mark Morrison  MR#: [id number] BIRTHDATE: 10-20-1945 , 67  yrs. old GENDER: Male ENDOSCOPIST: Jonette Eva, MD REFERRED [id number] Terri Piedra, M.D. PROCEDURE DATE:  11/11/2012 PROCEDURE:   Sigmoidoscopy with biopsy INDICATIONS:heme positive stools. MEDICATIONS: Demerol 50 mg IV and Versed 6 mg IV  DESCRIPTION OF PROCEDURE:    Physical exam was performed.  Informed consent was obtained from the patient after explaining the benefits, risks, and alternatives to procedure.  The patient was connected to monitor and placed in left lateral position. Continuous oxygen was provided by nasal cannula and IV medicine administered through an indwelling cannula.  After administration of sedation and rectal exam, the patients rectum was intubated and the EC-[id number] ([id number])  colonoscope was advanced under direct visualization to the cecum.  The scope was removed slowly by carefully examining the color, texture, anatomy, and integrity mucosa on the way out.  The patient was recovered in endoscopy and discharged home in satisfactory condition.    COLON FINDINGS: ULCERS IN THE SMALL BOWEL AND POUCH WITH INTERMITTENT NORMAL MUCOSA.  BIOPSIED VIA COLD FORCEPS.  ANAL CANAL 2 CM AND STENOSED TO  10 CM.  PREP QUALITY: goOD  COMPLICATIONS: None  ENDOSCOPIC IMPRESSION: ULCERS IN THE SMALL BOWEL AND POUCH ANAL CANAL STENOSED TO  10 CM.   RECOMMENDATIONS: HOLD IRON. BIOPSY WILL BE BACK IN 7 DAYS. FOLLOW UP IN 2 MOS.       _______________________________ Rosalie DoctorJonette Eva, MD 11/11/2012 5:14 PM

## 2012-11-11 NOTE — H&P (Signed)
Primary Care Physician:  Londell Moh, MD Primary Gastroenterologist:  Dr. Darrick Penna  Pre-Procedure History & Physical: HPI:  Mark Morrison is a 67 y.o. male here for HEME POS STOOLS.  Past Medical History  Diagnosis Date  . Ulcerative colitis 2004    with IPPA  . 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 History of DVT (deep vein thrombosis)     and PE  . Barrett's esophagus 02/2010    on EGD  . Vitamin B 12 deficiency   . BPH (benign prostatic hyperplasia)   . GERD (gastroesophageal reflux disease)   . Pulmonary nodules 04/01/2011  . Blood dyscrasia     dms,    per Dr. Leonia Corona  . COPD (chronic obstructive pulmonary disease)     emphasema,   xray done and 4 small areas found on test  . 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    Marland Kitchen Shortness of breath     with exertion  . Anxiety     Past Surgical History  Procedure Laterality Date  . Knee surgery      Left  . Abdominoperineal proctocolectomy  2004    with ileal anastomosis and anal pouch creation at [facility]  . Colonoscopy  11/22/2006    ULCERATIVE COLITIS  . Transurethral resection of prostate  02/03/2011    Procedure: TRANSURETHRAL RESECTION OF THE PROSTATE (TURP);  Surgeon: Ky Barban;  Location: [facility];  Service: Urology;  Laterality: N/A;  . Ileoscopy  03/14/2010    geographic ulceratios of the ileal pouch c/w pouchitis, more proximal distal ileum mucosa normal, biospy from ulcer benign  . Esophagogastroduodenoscopy  03/14/10    barret's esophagus, small hh, erosive duodenitis  . Colectomy    . Esophagogastroduodenoscopy  05/29/2011    SLF: Barrett's esophagus  6-7 CM SEGNMENT/ Moderate gastritis/ MILD Duodenitis/ MODERATE Hiatal hernia, Surveillance due 2016    Prior to Admission medications   Medication Sig Start Date End Date Taking? Authorizing Provider  ferrous sulfate 325 (65 FE) MG tablet Take 325 mg by mouth  daily after lunch.    Yes Historical Provider, MD  Multiple Vitamin (MULITIVITAMIN WITH MINERALS) TABS Take 1 tablet by mouth daily.   Yes Historical Provider, MD  pantoprazole (PROTONIX) 40 MG tablet Take 1 tablet (40 mg total) by mouth daily. 10/11/12  Yes Nira Retort, NP  vitamin C (ASCORBIC ACID) 500 MG tablet Take 500 mg by mouth daily.     Yes Historical Provider, MD  cyanocobalamin (,VITAMIN B-12,) 1000 MCG/ML injection Inject 1,000 mcg into the muscle every 3 (three) months.     Historical Provider, MD  dicyclomine (BENTYL) 10 MG capsule Take 10-20 mg by mouth 3 (three) times daily before meals.    Historical Provider, MD  lactase (LACTAID) 3000 UNITS tablet Take 1 tablet by mouth daily as needed.    Historical Provider, MD  loperamide (IMODIUM) 2 MG capsule Take 2 mg by mouth 3 (three) times daily. Takes with Dicyclomine    Historical Provider, MD  LORazepam (ATIVAN) 0.5 MG tablet Take 1 tablet (0.5 mg total) by mouth every 12 (twelve) hours as needed for anxiety. 07/01/12   Ellouise Newer, PA-C    Allergies as of 10/11/2012  . (No Known Allergies)    Family History  Problem Relation Age of Onset  . Anesthesia problems Neg Hx   . Hypotension Neg Hx   . Malignant  hyperthermia Neg Hx   . Pseudochol deficiency Neg Hx     History   Social History  . Marital Status: Married    Spouse Name: N/A    Number of Children: 2  . Years of Education: N/A   Occupational History  . retired, Therapist, sports, Psychologist, occupational    Social History Main Topics  . Smoking status: Current Every Day Smoker -- 0.50 packs/day for 50 years    Types: Cigarettes    Last Attempt to Quit: 03/23/2011  . Smokeless tobacco: Former Neurosurgeon    Quit date: 04/12/2011  . Alcohol Use: No  . Drug Use: No  . Sexual Activity: Not Currently   Other Topics Concern  . Not on file   Social History Narrative  . No narrative on file    Review of Systems: See HPI, otherwise negative ROS   Physical Exam: BP 113/62   Temp(Src) 97.7 F (36.5 C) (Oral)  Resp 19  Ht 5\' 7"  (1.702 m)  Wt 154 lb (69.854 kg)  BMI 24.11 kg/m2  SpO2 100% General:   Alert,  pleasant and cooperative in NAD Head:  Normocephalic and atraumatic. Neck:  Supple; Lungs:  Clear throughout to auscultation.    Heart:  Regular rate and rhythm. Abdomen:  Soft, nontender and nondistended. Normal bowel sounds, without guarding, and without rebound.   Neurologic:  Alert and  oriented x4;  grossly normal neurologically.  Impression/Plan:     HEME POS STOOLS  PLAN:  1.flex sig today

## 2012-11-14 ENCOUNTER — Encounter (HOSPITAL_BASED_OUTPATIENT_CLINIC_OR_DEPARTMENT_OTHER): Payer: Medicare Other

## 2012-11-14 DIAGNOSIS — D649 Anemia, unspecified: Secondary | ICD-10-CM

## 2012-11-14 NOTE — Progress Notes (Signed)
Labs drawn today for type and cross 

## 2012-11-15 ENCOUNTER — Encounter (HOSPITAL_COMMUNITY): Payer: Self-pay | Admitting: Gastroenterology

## 2012-11-15 ENCOUNTER — Encounter (HOSPITAL_BASED_OUTPATIENT_CLINIC_OR_DEPARTMENT_OTHER): Payer: Medicare Other

## 2012-11-15 VITALS — BP 104/61 | HR 74 | Temp 97.4°F | Resp 20

## 2012-11-15 DIAGNOSIS — D469 Myelodysplastic syndrome, unspecified: Secondary | ICD-10-CM

## 2012-11-15 DIAGNOSIS — D649 Anemia, unspecified: Secondary | ICD-10-CM

## 2012-11-15 MED ORDER — DARBEPOETIN ALFA-POLYSORBATE 300 MCG/0.6ML IJ SOLN
300.0000 ug | Freq: Once | INTRAMUSCULAR | Status: AC
  Administered 2012-11-15: 300 ug via SUBCUTANEOUS
  Filled 2012-11-15: qty 0.6

## 2012-11-15 MED ORDER — SODIUM CHLORIDE 0.9 % IJ SOLN
10.0000 mL | INTRAMUSCULAR | Status: AC | PRN
  Administered 2012-11-15: 10 mL

## 2012-11-15 MED ORDER — ACETAMINOPHEN 325 MG PO TABS
650.0000 mg | ORAL_TABLET | Freq: Once | ORAL | Status: AC
  Administered 2012-11-15: 650 mg via ORAL

## 2012-11-15 MED ORDER — ACETAMINOPHEN 325 MG PO TABS
ORAL_TABLET | ORAL | Status: AC
  Filled 2012-11-15: qty 2

## 2012-11-15 MED ORDER — DIPHENHYDRAMINE HCL 25 MG PO CAPS
50.0000 mg | ORAL_CAPSULE | Freq: Once | ORAL | Status: AC
  Administered 2012-11-15: 50 mg via ORAL

## 2012-11-15 MED ORDER — DIPHENHYDRAMINE HCL 25 MG PO CAPS
ORAL_CAPSULE | ORAL | Status: AC
  Filled 2012-11-15: qty 2

## 2012-11-15 MED ORDER — SODIUM CHLORIDE 0.9 % IV SOLN
250.0000 mL | Freq: Once | INTRAVENOUS | Status: AC
  Administered 2012-11-15: 250 mL via INTRAVENOUS

## 2012-11-15 NOTE — Progress Notes (Signed)
Mark Morrison presents today for transfusion of 2 units blood and  injection per MD orders. Tolerated transfusion without problems. [name] 300 mcg administered SQ in right Upper Arm. Administration without incident. Patient tolerated well.

## 2012-11-16 ENCOUNTER — Ambulatory Visit (HOSPITAL_COMMUNITY): Payer: Medicare Other

## 2012-11-16 LAB — TYPE AND SCREEN
Antibody Screen: POSITIVE
DAT, IgG: POSITIVE
Unit division: 0

## 2012-11-18 ENCOUNTER — Ambulatory Visit (HOSPITAL_COMMUNITY): Payer: Medicare Other

## 2012-11-22 ENCOUNTER — Telehealth: Payer: Self-pay | Admitting: Gastroenterology

## 2012-11-22 DIAGNOSIS — R197 Diarrhea, unspecified: Secondary | ICD-10-CM

## 2012-11-22 DIAGNOSIS — D649 Anemia, unspecified: Secondary | ICD-10-CM

## 2012-11-22 NOTE — Telephone Encounter (Signed)
Pt's wife called this afternoon. She said it was a week ago this past Friday since husband had TCS and they have not heard anything regarding his results. Please advise and call them at [phone]

## 2012-11-22 NOTE — Telephone Encounter (Signed)
I called pt's wife. Told her Dr. Evelina Dun has been very busy, and recent death in family. I will let her know they are requesting results and she will be in the office tomorrow, hopefully I will be able to call them back with the results.

## 2012-11-23 ENCOUNTER — Ambulatory Visit (HOSPITAL_COMMUNITY): Payer: Medicare Other

## 2012-11-25 ENCOUNTER — Encounter (HOSPITAL_COMMUNITY): Payer: Medicare Other | Attending: Oncology

## 2012-11-25 DIAGNOSIS — D469 Myelodysplastic syndrome, unspecified: Secondary | ICD-10-CM

## 2012-11-25 LAB — CBC
HCT: 22.3 % — ABNORMAL LOW (ref 39.0–52.0)
Hemoglobin: 8 g/dL — ABNORMAL LOW (ref 13.0–17.0)
MCHC: 35.9 g/dL (ref 30.0–36.0)
MCV: 110.4 fL — ABNORMAL HIGH (ref 78.0–100.0)
RDW: 22.6 % — ABNORMAL HIGH (ref 11.5–15.5)

## 2012-11-25 MED ORDER — DARBEPOETIN ALFA-POLYSORBATE 300 MCG/0.6ML IJ SOLN
INTRAMUSCULAR | Status: AC
  Filled 2012-11-25: qty 0.6

## 2012-11-25 MED ORDER — DARBEPOETIN ALFA-POLYSORBATE 300 MCG/0.6ML IJ SOLN
300.0000 ug | Freq: Once | INTRAMUSCULAR | Status: AC
  Administered 2012-11-25: 300 ug via SUBCUTANEOUS

## 2012-11-25 MED ORDER — PREDNISONE 10 MG PO TABS: ORAL_TABLET | ORAL | Status: DC

## 2012-11-25 MED ORDER — MESALAMINE ER 500 MG PO CPCR: ORAL_CAPSULE | ORAL | Status: DC

## 2012-11-25 NOTE — Progress Notes (Signed)
Mark Morrison presented for labwork and injection Labs per MD order drawn via Peripheral Line 25 gauge needle inserted in rt ac.  Good blood return present. Procedure without incident.  Needle removed intact.  [name] 300 mcg administered SQ in right Upper Arm. Administration without incident. Patient tolerated well.

## 2012-11-25 NOTE — Assessment & Plan Note (Signed)
DUE TO CROHN'S IBD

## 2012-11-25 NOTE — Assessment & Plan Note (Signed)
DUE TO ACTIVE IBD-SB CROHN'S

## 2012-11-25 NOTE — Addendum Note (Signed)
Addended by: [place] on: 11/25/2012 11:25 AM   Modules accepted: Orders

## 2012-11-25 NOTE — Telephone Encounter (Addendum)
Called patient TO DISCUSS RESULTS. SPOKE TO WIFE. PT LOSING WEIGHT DUE TO HAVING TO GO TO BR ALL THE TIME. PATH SHOWS IBD. 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 ADD PREDNISONE TAPER AND PENTASA 2 QID FOR ONE MO THE TID. WIFE VOICED UNDERSTANDING. OPV IN 6 WEEKS W/ SLF  E30 CROHN'S ILEITIS

## 2012-11-28 NOTE — Telephone Encounter (Signed)
Pt is aware of OV on 11/19 at 0830 with SF

## 2012-11-30 ENCOUNTER — Ambulatory Visit (HOSPITAL_COMMUNITY): Payer: Medicare Other

## 2012-12-02 ENCOUNTER — Ambulatory Visit (HOSPITAL_COMMUNITY): Payer: Medicare Other

## 2012-12-02 ENCOUNTER — Other Ambulatory Visit (HOSPITAL_COMMUNITY): Payer: Self-pay | Admitting: Hematology and Oncology

## 2012-12-02 ENCOUNTER — Encounter (HOSPITAL_BASED_OUTPATIENT_CLINIC_OR_DEPARTMENT_OTHER): Payer: Medicare Other

## 2012-12-02 VITALS — BP 101/61 | HR 104 | Resp 16

## 2012-12-02 DIAGNOSIS — D469 Myelodysplastic syndrome, unspecified: Secondary | ICD-10-CM

## 2012-12-02 LAB — CBC WITH DIFFERENTIAL/PLATELET
Basophils Relative: 0 % (ref 0–1)
Eosinophils Absolute: 0 10*3/uL (ref 0.0–0.7)
Eosinophils Relative: 0 % (ref 0–5)
Lymphs Abs: 0.5 10*3/uL — ABNORMAL LOW (ref 0.7–4.0)
MCH: 35.7 pg — ABNORMAL HIGH (ref 26.0–34.0)
MCHC: 31.9 g/dL (ref 30.0–36.0)
MCV: 111.9 fL — ABNORMAL HIGH (ref 78.0–100.0)
Monocytes Relative: 21 % — ABNORMAL HIGH (ref 3–12)
Neutrophils Relative %: 56 % (ref 43–77)
Platelets: 76 10*3/uL — ABNORMAL LOW (ref 150–400)

## 2012-12-02 MED ORDER — DARBEPOETIN ALFA-POLYSORBATE 300 MCG/0.6ML IJ SOLN
INTRAMUSCULAR | Status: AC
  Filled 2012-12-02: qty 0.6

## 2012-12-02 MED ORDER — DARBEPOETIN ALFA-POLYSORBATE 300 MCG/0.6ML IJ SOLN
300.0000 ug | Freq: Once | INTRAMUSCULAR | Status: AC
  Administered 2012-12-02: 300 ug via SUBCUTANEOUS

## 2012-12-02 MED ORDER — CYANOCOBALAMIN 1000 MCG/ML IJ SOLN: 1000.0000 ug | INTRAMUSCULAR | Status: DC

## 2012-12-02 NOTE — Progress Notes (Signed)
Labs drawn today for cbc/diff 

## 2012-12-02 NOTE — Progress Notes (Signed)
Mark Morrison presents today for injection per MD orders. [name] 300 mcg administered SQ in right Upper Arm. Administration without incident. Patient tolerated well.

## 2012-12-07 ENCOUNTER — Ambulatory Visit (HOSPITAL_COMMUNITY): Payer: Medicare Other

## 2012-12-08 ENCOUNTER — Telehealth: Payer: Self-pay

## 2012-12-08 DIAGNOSIS — R358 Other polyuria: Secondary | ICD-10-CM

## 2012-12-08 NOTE — Telephone Encounter (Signed)
Called and informed pt's wife and lab order faxed to [facility].

## 2012-12-08 NOTE — Telephone Encounter (Signed)
Pt's wife left VM and I returned her call. Spoke to her and pt. He said that he stopped taking the Pentasa altogether. He could not get out any BM when he was taking that. He is still taking the Prednisone bid and wonders if that could cause him to urinate a lot. He is also having diarrhea since he stopped the Pentasa. Please advise!

## 2012-12-08 NOTE — Telephone Encounter (Signed)
PLEASE CALL PT. HE SHOULD TRY TAKING PENTASA 2 PO BID. PREDNISONE WILL NOT MAKE HIM URINATE A LOT UNLESS IT IS CAUUING HIS GLUCOSE TO BE ELEVATED. HE SHOULD HAVE A FASTING BMP ON TOMORROW.

## 2012-12-09 ENCOUNTER — Encounter (HOSPITAL_BASED_OUTPATIENT_CLINIC_OR_DEPARTMENT_OTHER): Payer: Medicare Other

## 2012-12-09 ENCOUNTER — Ambulatory Visit (HOSPITAL_COMMUNITY): Payer: Medicare Other

## 2012-12-09 VITALS — BP 117/70 | HR 90

## 2012-12-09 DIAGNOSIS — D469 Myelodysplastic syndrome, unspecified: Secondary | ICD-10-CM

## 2012-12-09 LAB — BASIC METABOLIC PANEL
Calcium: 9 mg/dL (ref 8.4–10.5)
Chloride: 101 mEq/L (ref 96–112)
Creat: 0.84 mg/dL (ref 0.50–1.35)
Sodium: 137 mEq/L (ref 135–145)

## 2012-12-09 LAB — CBC
HCT: 27.8 % — ABNORMAL LOW (ref 39.0–52.0)
Hemoglobin: 9 g/dL — ABNORMAL LOW (ref 13.0–17.0)
RBC: 2.44 MIL/uL — ABNORMAL LOW (ref 4.22–5.81)

## 2012-12-09 MED ORDER — DARBEPOETIN ALFA-POLYSORBATE 300 MCG/0.6ML IJ SOLN
INTRAMUSCULAR | Status: AC
  Filled 2012-12-09: qty 0.6

## 2012-12-09 MED ORDER — DARBEPOETIN ALFA-POLYSORBATE 300 MCG/0.6ML IJ SOLN
300.0000 ug | Freq: Once | INTRAMUSCULAR | Status: AC
  Administered 2012-12-09: 300 ug via SUBCUTANEOUS

## 2012-12-09 NOTE — Progress Notes (Signed)
[name] 300 mcg given sub-q to left upper arm.  Tolerated well.

## 2012-12-09 NOTE — Progress Notes (Signed)
Labs drawn today for cbc 

## 2012-12-13 NOTE — Telephone Encounter (Signed)
PLEASE CALL PT. HIS GLUCOSE IS NORMAL. THE PREDNISONE IS NOT ELEVATING HIS GLUCOSE.

## 2012-12-13 NOTE — Telephone Encounter (Signed)
Called and informed pts wife

## 2012-12-14 ENCOUNTER — Ambulatory Visit (HOSPITAL_COMMUNITY): Payer: Medicare Other

## 2012-12-16 ENCOUNTER — Encounter (HOSPITAL_BASED_OUTPATIENT_CLINIC_OR_DEPARTMENT_OTHER): Payer: Medicare Other

## 2012-12-16 ENCOUNTER — Ambulatory Visit (HOSPITAL_COMMUNITY): Payer: Medicare Other

## 2012-12-16 VITALS — BP 129/75 | HR 89

## 2012-12-16 DIAGNOSIS — D469 Myelodysplastic syndrome, unspecified: Secondary | ICD-10-CM

## 2012-12-16 LAB — CBC
HCT: 28.6 % — ABNORMAL LOW (ref 39.0–52.0)
MCHC: 32.9 g/dL (ref 30.0–36.0)
MCV: 113.5 fL — ABNORMAL HIGH (ref 78.0–100.0)
RBC: 2.52 MIL/uL — ABNORMAL LOW (ref 4.22–5.81)
WBC: 3.7 10*3/uL — ABNORMAL LOW (ref 4.0–10.5)

## 2012-12-16 MED ORDER — DARBEPOETIN ALFA-POLYSORBATE 300 MCG/0.6ML IJ SOLN
300.0000 ug | Freq: Once | INTRAMUSCULAR | Status: AC
  Administered 2012-12-16: 300 ug via SUBCUTANEOUS

## 2012-12-16 MED ORDER — DARBEPOETIN ALFA-POLYSORBATE 300 MCG/0.6ML IJ SOLN
INTRAMUSCULAR | Status: AC
  Filled 2012-12-16: qty 0.6

## 2012-12-16 NOTE — Progress Notes (Signed)
Labs drawn today for cbc 

## 2012-12-16 NOTE — Progress Notes (Signed)
[name] 300 mcg given sub-q in left upper arm.  Tolerated injection well.

## 2012-12-19 ENCOUNTER — Encounter (INDEPENDENT_AMBULATORY_CARE_PROVIDER_SITE_OTHER): Payer: Self-pay

## 2012-12-19 ENCOUNTER — Ambulatory Visit (INDEPENDENT_AMBULATORY_CARE_PROVIDER_SITE_OTHER): Payer: Medicare Other | Admitting: Gastroenterology

## 2012-12-19 VITALS — BP 106/59 | HR 88 | Temp 97.2°F | Wt 146.0 lb

## 2012-12-19 DIAGNOSIS — K5190 Ulcerative colitis, unspecified, without complications: Secondary | ICD-10-CM

## 2012-12-19 DIAGNOSIS — K2270 Barrett's esophagus without dysplasia: Secondary | ICD-10-CM

## 2012-12-19 DIAGNOSIS — K5000 Crohn's disease of small intestine without complications: Secondary | ICD-10-CM

## 2012-12-19 MED ORDER — MESALAMINE 1000 MG RE SUPP: 1000.0000 mg | Freq: Two times a day (BID) | RECTAL | Status: DC

## 2012-12-19 MED ORDER — MESALAMINE 800 MG PO TBEC: 2.0000 | DELAYED_RELEASE_TABLET | Freq: Three times a day (TID) | ORAL | Status: DC

## 2012-12-19 NOTE — Patient Instructions (Addendum)
Start taking Asacol HD, 2 capsules by mouth three times a day. I have also provided suppositories to take twice a day for 10 days. I will be talking with Dr. Darrick Penna about further management.   Take Miralax daily as needed to have a bowel movement.   We will see you in 4 weeks. Please call if you have worsening or no improvement of symptoms.

## 2012-12-19 NOTE — Progress Notes (Signed)
Referring Provider: Londell Moh,* Primary Care Physician:  Londell Moh, MD Primary GI: Dr. Darrick Penna   Chief Complaint  Patient presents with  . Diarrhea    HPI:   67 year old male returns today in follow-up after flex sig, which was performed secondary to heme positive stool. He has history of ulcerative colitis status post proctocolectomy with ileal anastomosis and anal pouch creation and history of recurrent pouchitis. Also hx notable for Barrett's, with last EGD in April 2013. Needs surveillance in April 2016.  Ileoscopy revealed ulcers in the small bowel and pouch with intermittent normal mucosa. Anal canal 2 cm and stenosed to 10 cm. 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 He was started on Prednisone taper and Pentasa 2 QID X 1 month then TID. However, patient called in stating Pentasa caused significant constipation. Tried to decrease Pentasa to 2 po BID.   Returns now off of Pentasa. Stopped Pentasa, stopped him up. Still on Prednisone, down to one pill a day. Has bad loose stools then sometimes doesn't want to come out. Thinks he has hemorrhoids. Stool just drips out sometimes. No bright red blood per rectum. Rectal discomfort.   Past Medical History  Diagnosis Date  . Ulcerative colitis 2004    with IPPA  . 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 History of DVT (deep vein thrombosis)     and PE  . Barrett's esophagus 02/2010    on EGD  . Vitamin B 12 deficiency   . BPH (benign prostatic hyperplasia)   . GERD (gastroesophageal reflux disease)   . Pulmonary nodules 04/01/2011  . Blood dyscrasia     dms,    per Dr. Leonia Corona  . COPD (chronic obstructive pulmonary disease)     emphasema,   xray done and 4 small areas found on test  . 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    Marland Kitchen Shortness of breath     with exertion  . Anxiety     Past Surgical History  Procedure  Laterality Date  . Knee surgery      Left  . Abdominoperineal proctocolectomy  2004    with ileal anastomosis and anal pouch creation at [facility]  . Colonoscopy  11/22/2006    ULCERATIVE COLITIS  . Transurethral resection of prostate  02/03/2011    Procedure: TRANSURETHRAL RESECTION OF THE PROSTATE (TURP);  Surgeon: Ky Barban;  Location: [facility];  Service: Urology;  Laterality: N/A;  . Ileoscopy  03/14/2010    geographic ulceratios of the ileal pouch c/w pouchitis, more proximal distal ileum mucosa normal, biospy from ulcer benign  . Esophagogastroduodenoscopy  03/14/10    barret's esophagus, small hh, erosive duodenitis  . Colectomy    . Esophagogastroduodenoscopy  05/29/2011    SLF: Barrett's esophagus  6-7 CM SEGNMENT/ Moderate gastritis/ MILD Duodenitis/ MODERATE Hiatal hernia, Surveillance due 2016  . Flexible sigmoidoscopy N/A 11/11/2012    ZOX:WRUEAV IN THE SMALL BOWEL AND POUCH ANAL CANAL STENOSED TO 10 cm    Current Outpatient Prescriptions  Medication Sig Dispense Refill  . cyanocobalamin (,VITAMIN B-12,) 1000 MCG/ML injection Inject 1,000 mcg into the muscle every 3 (three) months.       . dicyclomine (BENTYL) 10 MG capsule Take 10-20 mg by mouth 3 (three) times daily before meals.      . lactase (LACTAID) 3000 UNITS tablet Take 1 tablet by mouth daily as needed.      Marland Kitchen  loperamide (IMODIUM) 2 MG capsule Take 2 mg by mouth 3 (three) times daily. Takes with Dicyclomine      . LORazepam (ATIVAN) 0.5 MG tablet Take 1 tablet (0.5 mg total) by mouth every 12 (twelve) hours as needed for anxiety.  30 tablet  0  . Multiple Vitamin (MULITIVITAMIN WITH MINERALS) TABS Take 1 tablet by mouth daily.      . pantoprazole (PROTONIX) 40 MG tablet Take 1 tablet (40 mg total) by mouth daily.  90 tablet  3  . predniSONE (DELTASONE) 10 MG tablet Take 10 mg by mouth daily. 2 PO QD FOR 14 DAYS THEN  THEN 1 PO QD FOR 14 DAYS      . vitamin C (ASCORBIC ACID) 500 MG tablet Take 500 mg by mouth daily.         . Mesalamine (ASACOL HD) 800 MG TBEC Take 2 tablets (1,600 mg total) by mouth 3 (three) times daily.  180 tablet  3  . mesalamine (CANASA) 1000 MG suppository Place 1 suppository (1,000 mg total) rectally 2 (two) times daily. For 10 days  30 suppository  1   No current facility-administered medications for this visit.    Allergies as of 12/19/2012  . (No Known Allergies)    Family History  Problem Relation Age of Onset  . Anesthesia problems Neg Hx   . Hypotension Neg Hx   . Malignant hyperthermia Neg Hx   . Pseudochol deficiency Neg Hx     History   Social History  . Marital Status: Married    Spouse Name: N/A    Number of Children: 2  . Years of Education: N/A   Occupational History  . retired, Therapist, sports, Psychologist, occupational    Social History Main Topics  . Smoking status: Current Every Day Smoker -- 0.50 packs/day for 50 years    Types: Cigarettes    Last Attempt to Quit: 03/23/2011  . Smokeless tobacco: Former Neurosurgeon    Quit date: 04/12/2011  . Alcohol Use: No  . Drug Use: No  . Sexual Activity: Not Currently   Other Topics Concern  . None   Social History Narrative  . None    Review of Systems: As mentioned in HPI.   Physical Exam: BP 106/59  Pulse 88  Temp(Src) 97.2 F (36.2 C) (Oral)  Wt 146 lb (66.225 kg)  BMI 22.86 kg/m2 General:   Alert and oriented. No distress noted. Pleasant and cooperative.  Head:  Normocephalic and atraumatic. Heart:  S1, S2 present  Abdomen:  +BS, soft, non-tender and non-distended. No rebound or guarding. No HSM or masses noted. Rectal: external exam with inflamed-appearing opening of rectum, ulcer-like white/red plaques; internal exam limited due to discomfort. No gross blood identified. Small amount of stool leakage on external exam Extremities:  Without edema. Neurologic:  Alert and  oriented x4;  grossly normal neurologically. Psych:  Alert and cooperative. Normal mood and affect.

## 2012-12-20 DIAGNOSIS — K5000 Crohn's disease of small intestine without complications: Secondary | ICD-10-CM | POA: Insufficient documentation

## 2012-12-20 NOTE — Assessment & Plan Note (Signed)
On recent ileoscopy performed secondary to heme positive stool. Ulcers noted in small bowel and pouch with intermittent normal mucosa. Nearing the end of Prednisone taper, unable to tolerate Pentasa due to constipation per patient. He has completely stopped taking this and does not want to adjust it further. Willing to try a different agent. Although less ideal, will provide samples of Asacol HD 2 po TID to take until further discussion with Dr. Darrick Penna. Rectal area appears inflamed; short course of Canasa suppositories. Return in 4 weeks with Dr. Darrick Penna.

## 2012-12-20 NOTE — Progress Notes (Signed)
cc'd to pcp 

## 2012-12-20 NOTE — Assessment & Plan Note (Signed)
status post proctocolectomy with ileal anastomosis and anal pouch creation and history of recurrent pouchitis.

## 2012-12-20 NOTE — Assessment & Plan Note (Signed)
Needs surveillance in April 2016.

## 2012-12-21 ENCOUNTER — Ambulatory Visit (HOSPITAL_COMMUNITY): Payer: Medicare Other

## 2012-12-23 ENCOUNTER — Encounter (HOSPITAL_BASED_OUTPATIENT_CLINIC_OR_DEPARTMENT_OTHER): Payer: Medicare Other

## 2012-12-23 ENCOUNTER — Ambulatory Visit (HOSPITAL_COMMUNITY): Payer: Medicare Other

## 2012-12-23 VITALS — BP 108/70 | HR 99 | Resp 16

## 2012-12-23 DIAGNOSIS — D469 Myelodysplastic syndrome, unspecified: Secondary | ICD-10-CM

## 2012-12-23 LAB — CBC
HCT: 27.6 % — ABNORMAL LOW (ref 39.0–52.0)
Hemoglobin: 9.5 g/dL — ABNORMAL LOW (ref 13.0–17.0)
MCH: 39.1 pg — ABNORMAL HIGH (ref 26.0–34.0)
MCV: 113.6 fL — ABNORMAL HIGH (ref 78.0–100.0)
RBC: 2.43 MIL/uL — ABNORMAL LOW (ref 4.22–5.81)

## 2012-12-23 MED ORDER — DARBEPOETIN ALFA-POLYSORBATE 300 MCG/0.6ML IJ SOLN
300.0000 ug | Freq: Once | INTRAMUSCULAR | Status: AC
  Administered 2012-12-23: 300 ug via SUBCUTANEOUS

## 2012-12-23 MED ORDER — DARBEPOETIN ALFA-POLYSORBATE 300 MCG/0.6ML IJ SOLN
INTRAMUSCULAR | Status: AC
  Filled 2012-12-23: qty 0.6

## 2012-12-23 NOTE — Progress Notes (Signed)
Labs drawn today for cbc 

## 2012-12-23 NOTE — Progress Notes (Signed)
Mark Morrison presents today for injection per MD orders. [name] 300 mcg administered SQ in left Upper Arm. Administration without incident. Patient tolerated well.

## 2012-12-26 ENCOUNTER — Other Ambulatory Visit (HOSPITAL_COMMUNITY): Payer: Medicare Other

## 2012-12-28 ENCOUNTER — Ambulatory Visit (HOSPITAL_COMMUNITY): Payer: Medicare Other

## 2012-12-29 NOTE — Progress Notes (Signed)
Discussed with Dr. Darrick Penna. Asacol not likely to help with current symptoms. Let's see how he is doing with this.  Decrease Pentasa to BID if Asacol not effective. If he is unwilling to use Pentasa, the next option would be Imuran, Remicade, or Humira. I would need to discuss side effects with him of these medications.   Let's have him return to see me in a few weeks to discuss this. Can cancel upcoming appt with Dr. Darrick Penna. Will see how he is doing when I see him and review the risks and benefits of these medications.

## 2012-12-29 NOTE — Progress Notes (Signed)
Called and informed pt's wife. She said the Asacol has not helped. He will begin the Pentasa bid and see how he does. OV with AS on 01/16/2013 at 3:00 pm,  and appt on 01/26/2013 with SF cancelled per Anna's request.

## 2012-12-30 ENCOUNTER — Encounter (HOSPITAL_COMMUNITY): Payer: Medicare Other | Attending: Oncology

## 2012-12-30 ENCOUNTER — Ambulatory Visit (HOSPITAL_COMMUNITY): Payer: Medicare Other

## 2012-12-30 ENCOUNTER — Encounter (HOSPITAL_BASED_OUTPATIENT_CLINIC_OR_DEPARTMENT_OTHER): Payer: Medicare Other

## 2012-12-30 VITALS — BP 116/71 | HR 103

## 2012-12-30 DIAGNOSIS — D469 Myelodysplastic syndrome, unspecified: Secondary | ICD-10-CM

## 2012-12-30 DIAGNOSIS — R599 Enlarged lymph nodes, unspecified: Secondary | ICD-10-CM | POA: Insufficient documentation

## 2012-12-30 LAB — CBC
HCT: 27.7 % — ABNORMAL LOW (ref 39.0–52.0)
Hemoglobin: 9.2 g/dL — ABNORMAL LOW (ref 13.0–17.0)
MCH: 36.9 pg — ABNORMAL HIGH (ref 26.0–34.0)
MCV: 111.2 fL — ABNORMAL HIGH (ref 78.0–100.0)
Platelets: 79 10*3/uL — ABNORMAL LOW (ref 150–400)
RDW: 19.3 % — ABNORMAL HIGH (ref 11.5–15.5)
WBC: 3.2 10*3/uL — ABNORMAL LOW (ref 4.0–10.5)

## 2012-12-30 MED ORDER — DARBEPOETIN ALFA-POLYSORBATE 300 MCG/0.6ML IJ SOLN
INTRAMUSCULAR | Status: AC
  Filled 2012-12-30: qty 0.6

## 2012-12-30 MED ORDER — DARBEPOETIN ALFA-POLYSORBATE 300 MCG/0.6ML IJ SOLN
300.0000 ug | Freq: Once | INTRAMUSCULAR | Status: AC
Start: 2012-12-30 — End: 2012-12-30
  Administered 2012-12-30: 300 ug via SUBCUTANEOUS

## 2012-12-30 NOTE — Progress Notes (Signed)
Labs drawn today for cbc 

## 2012-12-30 NOTE — Progress Notes (Signed)
Mark Morrison presents today for injection per MD orders. [name] 300 mcg administered SQ in  Abdomen. Administration without incident. Patient tolerated well.

## 2013-01-02 ENCOUNTER — Ambulatory Visit (HOSPITAL_COMMUNITY): Payer: Medicare Other

## 2013-01-06 ENCOUNTER — Encounter (HOSPITAL_BASED_OUTPATIENT_CLINIC_OR_DEPARTMENT_OTHER): Payer: Medicare Other

## 2013-01-06 VITALS — BP 102/67 | HR 113

## 2013-01-06 DIAGNOSIS — D469 Myelodysplastic syndrome, unspecified: Secondary | ICD-10-CM

## 2013-01-06 LAB — CBC
HCT: 26.6 % — ABNORMAL LOW (ref 39.0–52.0)
Hemoglobin: 8.6 g/dL — ABNORMAL LOW (ref 13.0–17.0)
MCH: 35.7 pg — ABNORMAL HIGH (ref 26.0–34.0)
MCHC: 32.3 g/dL (ref 30.0–36.0)
MCV: 110.4 fL — ABNORMAL HIGH (ref 78.0–100.0)
RBC: 2.41 MIL/uL — ABNORMAL LOW (ref 4.22–5.81)
RDW: 19.2 % — ABNORMAL HIGH (ref 11.5–15.5)

## 2013-01-06 MED ORDER — DARBEPOETIN ALFA-POLYSORBATE 300 MCG/0.6ML IJ SOLN
INTRAMUSCULAR | Status: AC
  Filled 2013-01-06: qty 0.6

## 2013-01-06 MED ORDER — DARBEPOETIN ALFA-POLYSORBATE 300 MCG/0.6ML IJ SOLN
300.0000 ug | Freq: Once | INTRAMUSCULAR | Status: AC
Start: 2013-01-06 — End: 2013-01-06
  Administered 2013-01-06: 300 ug via SUBCUTANEOUS

## 2013-01-06 NOTE — Progress Notes (Signed)
Glenda Chroman presents today for injection per MD orders. Procrit 300 units administered SQ in left Upper Arm. Administration without incident. Patient tolerated well.

## 2013-01-06 NOTE — Progress Notes (Signed)
Labs drawn today for cbc 

## 2013-01-11 ENCOUNTER — Ambulatory Visit: Payer: Medicare Other | Admitting: Gastroenterology

## 2013-01-13 ENCOUNTER — Encounter (HOSPITAL_BASED_OUTPATIENT_CLINIC_OR_DEPARTMENT_OTHER): Payer: Medicare Other

## 2013-01-13 ENCOUNTER — Telehealth: Payer: Self-pay | Admitting: Internal Medicine

## 2013-01-13 ENCOUNTER — Encounter (HOSPITAL_BASED_OUTPATIENT_CLINIC_OR_DEPARTMENT_OTHER): Payer: Medicare Other | Admitting: Oncology

## 2013-01-13 VITALS — BP 109/76 | HR 118 | Temp 97.5°F | Resp 18 | Wt 145.6 lb

## 2013-01-13 DIAGNOSIS — D469 Myelodysplastic syndrome, unspecified: Secondary | ICD-10-CM

## 2013-01-13 DIAGNOSIS — R599 Enlarged lymph nodes, unspecified: Secondary | ICD-10-CM

## 2013-01-13 DIAGNOSIS — R591 Generalized enlarged lymph nodes: Secondary | ICD-10-CM

## 2013-01-13 DIAGNOSIS — D61818 Other pancytopenia: Secondary | ICD-10-CM

## 2013-01-13 LAB — COMPREHENSIVE METABOLIC PANEL
AST: 20 U/L (ref 0–37)
BUN: 13 mg/dL (ref 6–23)
CO2: 27 mEq/L (ref 19–32)
Chloride: 97 mEq/L (ref 96–112)
Creatinine, Ser: 0.84 mg/dL (ref 0.50–1.35)
GFR calc non Af Amer: 87 mL/min — ABNORMAL LOW (ref >90)
Glucose, Bld: 99 mg/dL (ref 70–99)
Total Bilirubin: 2 mg/dL — ABNORMAL HIGH (ref 0.3–1.2)
Total Protein: 7.5 g/dL (ref 6.0–8.3)

## 2013-01-13 LAB — CBC WITH DIFFERENTIAL/PLATELET
Basophils Absolute: 0 10*3/uL (ref 0.0–0.1)
Basophils Relative: 1 % (ref 0–1)
Eosinophils Absolute: 0 10*3/uL (ref 0.0–0.7)
Eosinophils Relative: 0 % (ref 0–5)
HCT: 22.7 % — ABNORMAL LOW (ref 39.0–52.0)
Hemoglobin: 7.9 g/dL — ABNORMAL LOW (ref 13.0–17.0)
Lymphocytes Relative: 26 % (ref 12–46)
Lymphs Abs: 0.5 10*3/uL — ABNORMAL LOW (ref 0.7–4.0)
MCH: 38.7 pg — ABNORMAL HIGH (ref 26.0–34.0)
MCHC: 34.8 g/dL (ref 30.0–36.0)
MCV: 111.3 fL — ABNORMAL HIGH (ref 78.0–100.0)
Monocytes Absolute: 0.5 10*3/uL (ref 0.1–1.0)
Monocytes Relative: 27 % — ABNORMAL HIGH (ref 3–12)
Neutro Abs: 0.9 10*3/uL — ABNORMAL LOW (ref 1.7–7.7)
Neutrophils Relative %: 46 % (ref 43–77)
Platelets: DECREASED 10*3/uL (ref 150–400)
RBC: 2.04 MIL/uL — ABNORMAL LOW (ref 4.22–5.81)
RDW: 19 % — ABNORMAL HIGH (ref 11.5–15.5)
WBC: 1.9 10*3/uL — ABNORMAL LOW (ref 4.0–10.5)

## 2013-01-13 LAB — SEDIMENTATION RATE: Sed Rate: 120 mm/hr — ABNORMAL HIGH (ref 0–16)

## 2013-01-13 LAB — C-REACTIVE PROTEIN: CRP: 6.1 mg/dL — ABNORMAL HIGH (ref <0.60)

## 2013-01-13 LAB — LACTATE DEHYDROGENASE: LDH: 195 U/L (ref 94–250)

## 2013-01-13 MED ORDER — CIPROFLOXACIN HCL 500 MG PO TABS: 500.0000 mg | ORAL_TABLET | Freq: Two times a day (BID) | ORAL | Status: DC

## 2013-01-13 MED ORDER — METRONIDAZOLE 500 MG PO TABS: 500.0000 mg | ORAL_TABLET | Freq: Three times a day (TID) | ORAL | Status: DC

## 2013-01-13 MED ORDER — DARBEPOETIN ALFA-POLYSORBATE 300 MCG/0.6ML IJ SOLN
300.0000 ug | Freq: Once | INTRAMUSCULAR | Status: AC
  Administered 2013-01-13: 300 ug via SUBCUTANEOUS

## 2013-01-13 MED ORDER — DARBEPOETIN ALFA-POLYSORBATE 300 MCG/0.6ML IJ SOLN
INTRAMUSCULAR | Status: AC
  Filled 2013-01-13: qty 0.6

## 2013-01-13 NOTE — Progress Notes (Signed)
In for pre-injection labs and complains with swelling in right neck.  "Has been there for about a week and I've had something like this before and it goes away.  This time it hasn't. I'm also losing weight because my everything I eat just runs right through me."

## 2013-01-13 NOTE — Progress Notes (Signed)
Labs drawn today for cbc/diff,kllc,mm,sed rate,ldh,crp,cmp

## 2013-01-13 NOTE — Telephone Encounter (Signed)
Tried to call pt- LMOM 

## 2013-01-13 NOTE — Telephone Encounter (Signed)
Patient has been treated for pouchitis in the past on multiple different occasions.   Should be on Pentasa now, as Asacol likely not effective. Asacol was sampled due to patient not wanting to continue Pentasa at last office visit. He has an upcoming appt to discuss Imuran, Humira, or Remicade if he is not able to obtain symptom control with Pentasa.   I have sent a short course of Cipro and Flagyl to pharmacy, which he has used in the past for recurrent pouchitis.   Attempted to contact patient at home but had to leave message. I made sure the message included to seek medical attention if worsening of symptoms and to call us first thing Monday, as the office is closed now.

## 2013-01-13 NOTE — Patient Instructions (Signed)
[facility] Discharge Instructions  RECOMMENDATIONS MADE BY THE CONSULTANT AND ANY TEST RESULTS WILL BE SENT TO YOUR REFERRING PHYSICIAN.  EXAM FINDINGS BY THE PHYSICIAN TODAY AND SIGNS OR SYMPTOMS TO REPORT TO CLINIC OR PRIMARY PHYSICIAN: Exam and findings as discussed by Dellis Anes, PA-C.  You have several nodules and we need to get a biopsy done to see what's going on.  Also need to get CT scans of your neck, chest, abdomen and pelvis to evaluate your weight loss and the enlarged nodes that were found on exam today.  Will make a referral to Dr. Malvin Johns to get a biopsy of the area.  MEDICATIONS PRESCRIBED:  none  INSTRUCTIONS/FOLLOW-UP: Dr. Malvin Johns will see you on 01/17/13 at 10:30am CT scans will be done on 12/1 - see schedule. Will see you back in 2 weeks to follow-up test results.  Thank you for choosing [facility] to provide your oncology and hematology care.  To afford each patient quality time with our providers, please arrive at least 15 minutes before your scheduled appointment time.  With your help, our goal is to use those 15 minutes to complete the necessary work-up to ensure our physicians have the information they need to help with your evaluation and healthcare recommendations.    Effective January 1st, 2014, we ask that you re-schedule your appointment with our physicians should you arrive 10 or more minutes late for your appointment.  We strive to give you quality time with our providers, and arriving late affects you and other patients whose appointments are after yours.    Again, thank you for choosing [facility].  Our hope is that these requests will decrease the amount of time that you wait before being seen by our physicians.       _____________________________________________________________  Should you have questions after your visit to [facility], please contact our office at [phone] between  the hours of 8:30 a.m. and 5:00 p.m.  Voicemails left after 4:30 p.m. will not be returned until the following business day.  For prescription refill requests, have your pharmacy contact our office with your prescription refill request.

## 2013-01-13 NOTE — Addendum Note (Signed)
Addended by: Nira Retort on: 01/13/2013 12:42 PM   Modules accepted: Orders

## 2013-01-13 NOTE — Progress Notes (Signed)
Mark Morrison presents today for injection per MD orders. [name] 300 mcg administered SQ in left Abdomen. Administration without incident. Patient tolerated well.

## 2013-01-13 NOTE — Progress Notes (Signed)
Mark Morrison is seen as a work-in today.  He is here for lab work.  He has missed past appointments.   While getting blood work, he informed the nurse about a "neck lump" on the right.  It was clearly visible and therefore I was asked to see the patient today.   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   Recent bone marrow aspiration and biopsy was negative for any transformation.  We have been supporting his counts with ESA as he does not meet treatment parameters.   He reports that his PCP saw the neck lump and told him that it was a gland and to not worry about it as it will go away.  Additionally, He is having issues with diarrhea secondary to his colon issues that GI are following.  He is S/P colon resection for ulcerative colitis.   Today, it is noted that he is experiencing a 20 lbs weight loss x 1 year.    He denies any B symptoms, but his biggest complaint is the diarrhea.  He has an upcoming appointment with [name] but "I am going to cancel that."  PE: BP 109/76  Pulse 118  Temp(Src) 97.5 F (36.4 C) (Oral)  Resp 18  Wt 145 lb 9.6 oz (66.044 kg) Gen: Fatigue, flattened affect, NAD HEENT: Atraumatic, normocephalic Neck: Right anterior cervical chain lymph node measuring 3 x 2 cm in size with additional sub-centimeter lymph nodes in the right supraclavicular region (3).   Cardiac: RRR Lungs: CTA Abdomen: + BS x 4, no organomegaly Skin: Warm and dry Lymph: See neck exam, shotty inguinal nodes B/L.  No axillary nodes appreciated Neuro: A and O x 3   Assessment:  1. New right anterior cervical lymph node with right supraclavicular lymph nodes 2. Pancytopenia, secondary to MDS 3. MDS  Plan: 1. Referral to Gen Surgeon for excisional lymph node biopsy of right neck lymph node.  Send for Flow cytemetry testing as well.  2. CT Neck, CAP with contrast on 12/1 3. Labs today:  CBC diff, CMET, LDH, CRP, MM panel, ESR 4. [name] injection today 5. Return in 2-3 weeks for follow-up  All questions were answered.    Patient and plan discussed with Dr. Alla German and he is in agreement with the aforementioned.   More than 50% of the time spent with the patient was utilized for counseling and coordination of care.   Mark Morrison  Addendum: CBC noted.  Hgb 7.9 g/dL. WBC 1.9.  Patient is asymptomatic from anemia.  PRBC transfusion not indicated.   Mark Morrison

## 2013-01-13 NOTE — Telephone Encounter (Signed)
Pt's wife called wanting [place] to call in an antibiotic for her husband today at [facility]. She stressed that it had to be done today. [phone]

## 2013-01-13 NOTE — Telephone Encounter (Signed)
Spoke with pts wife, pt has been having watery diarrhea for several days. No abd pain, no n/v, no fever, no recent abx, no blood in stool. He is currently taking asachol. She thinks he needs abx for the diarrhea.  Pt also have ov for Monday and they need to reschedule. Darl Pikes, please reschedule .

## 2013-01-16 ENCOUNTER — Ambulatory Visit: Payer: Medicare Other | Admitting: Gastroenterology

## 2013-01-16 ENCOUNTER — Encounter: Payer: Self-pay | Admitting: Gastroenterology

## 2013-01-16 LAB — KAPPA/LAMBDA LIGHT CHAINS
Kappa free light chain: 12.7 mg/dL — ABNORMAL HIGH (ref 0.33–1.94)
Kappa, lambda light chain ratio: 0.91 (ref 0.26–1.65)
Lambda free light chains: 13.9 mg/dL — ABNORMAL HIGH (ref 0.57–2.63)

## 2013-01-16 NOTE — Telephone Encounter (Signed)
Pt is aware of OV on 12/17 at 9 with SF and appt card was mailed

## 2013-01-18 LAB — MULTIPLE MYELOMA PANEL, SERUM
Alpha-1-Globulin: 7.8 % — ABNORMAL HIGH (ref 2.9–4.9)
Alpha-2-Globulin: 7.9 % (ref 7.1–11.8)
Gamma Globulin: 33.3 % — ABNORMAL HIGH (ref 11.1–18.8)
IgG (Immunoglobin G), Serum: 2430 mg/dL — ABNORMAL HIGH (ref 650–1600)
IgM, Serum: 152 mg/dL (ref 41–251)
M-Spike, %: NOT DETECTED g/dL

## 2013-01-23 ENCOUNTER — Ambulatory Visit (HOSPITAL_COMMUNITY)
Admission: RE | Admit: 2013-01-23 | Discharge: 2013-01-23 | Disposition: A | Discharge status: Home / Self Care | Payer: Medicare Other | Source: Ambulatory Visit | Attending: Oncology | Admitting: Oncology

## 2013-01-23 ENCOUNTER — Ambulatory Visit (HOSPITAL_COMMUNITY): Payer: Medicare Other

## 2013-01-23 ENCOUNTER — Other Ambulatory Visit (HOSPITAL_COMMUNITY): Payer: Medicare Other

## 2013-01-23 DIAGNOSIS — I2584 Coronary atherosclerosis due to calcified coronary lesion: Secondary | ICD-10-CM | POA: Insufficient documentation

## 2013-01-23 DIAGNOSIS — R591 Generalized enlarged lymph nodes: Secondary | ICD-10-CM

## 2013-01-23 DIAGNOSIS — K8020 Calculus of gallbladder without cholecystitis without obstruction: Secondary | ICD-10-CM | POA: Insufficient documentation

## 2013-01-23 DIAGNOSIS — R599 Enlarged lymph nodes, unspecified: Secondary | ICD-10-CM | POA: Insufficient documentation

## 2013-01-23 DIAGNOSIS — I7090 Unspecified atherosclerosis: Secondary | ICD-10-CM | POA: Insufficient documentation

## 2013-01-23 DIAGNOSIS — R911 Solitary pulmonary nodule: Secondary | ICD-10-CM | POA: Insufficient documentation

## 2013-01-23 DIAGNOSIS — M47812 Spondylosis without myelopathy or radiculopathy, cervical region: Secondary | ICD-10-CM | POA: Insufficient documentation

## 2013-01-23 DIAGNOSIS — D469 Myelodysplastic syndrome, unspecified: Secondary | ICD-10-CM | POA: Insufficient documentation

## 2013-01-23 DIAGNOSIS — N400 Enlarged prostate without lower urinary tract symptoms: Secondary | ICD-10-CM | POA: Insufficient documentation

## 2013-01-23 MED ORDER — SODIUM CHLORIDE 0.9 % IJ SOLN
INTRAMUSCULAR | Status: AC
  Filled 2013-01-23: qty 500

## 2013-01-23 MED ORDER — IOHEXOL 300 MG/ML  SOLN
120.0000 mL | Freq: Once | INTRAMUSCULAR | Status: AC | PRN
  Administered 2013-01-23: 120 mL via INTRAVENOUS

## 2013-01-24 ENCOUNTER — Other Ambulatory Visit (HOSPITAL_COMMUNITY): Payer: Self-pay | Admitting: Hematology and Oncology

## 2013-01-24 ENCOUNTER — Encounter (HOSPITAL_COMMUNITY): Payer: Medicare Other | Attending: Oncology

## 2013-01-24 ENCOUNTER — Encounter (HOSPITAL_BASED_OUTPATIENT_CLINIC_OR_DEPARTMENT_OTHER): Payer: Medicare Other

## 2013-01-24 DIAGNOSIS — C8589 Other specified types of non-Hodgkin lymphoma, extranodal and solid organ sites: Secondary | ICD-10-CM | POA: Insufficient documentation

## 2013-01-24 DIAGNOSIS — D638 Anemia in other chronic diseases classified elsewhere: Secondary | ICD-10-CM

## 2013-01-24 DIAGNOSIS — D469 Myelodysplastic syndrome, unspecified: Secondary | ICD-10-CM | POA: Insufficient documentation

## 2013-01-24 DIAGNOSIS — D649 Anemia, unspecified: Secondary | ICD-10-CM | POA: Insufficient documentation

## 2013-01-24 DIAGNOSIS — D61818 Other pancytopenia: Secondary | ICD-10-CM

## 2013-01-24 LAB — CBC
MCH: 41.8 pg — ABNORMAL HIGH (ref 26.0–34.0)
Platelets: 80 10*3/uL — ABNORMAL LOW (ref 150–400)
RBC: 1.58 MIL/uL — ABNORMAL LOW (ref 4.22–5.81)
RDW: 23.2 % — ABNORMAL HIGH (ref 11.5–15.5)
WBC: 2 10*3/uL — ABNORMAL LOW (ref 4.0–10.5)

## 2013-01-24 LAB — FERRITIN: Ferritin: 482 ng/mL — ABNORMAL HIGH (ref 22–322)

## 2013-01-24 NOTE — Progress Notes (Signed)
Labs drawn today for cbc 

## 2013-01-24 NOTE — Progress Notes (Signed)
CRITICAL VALUE ALERT Critical value received:  hgb 6.6 Date of notification:  01/24/13 Time of notification: 1050 Critical value read back:  yes Nurse who received alert:  T.Karo Rog,RN MD notified (1st page):  1053 discussed results with Dr.Formanek. Orders to hold aranesp today, draw a ferritin level. Discussed above with pt. Denies blood in his stool or urine. Reports he is only mildly dizzy at times but this is not a change from the past several weeks. Pt denies feeling any differently than in the past few weeks. Pt does not want blood transfusion. Discussed with him possibility of iron infusion and he is agreeable to this. Will wait for ferritin level results and schedule for iron infusion if needed. Mark Morrison presented for labwork. Labs per MD order drawn via Peripheral Line 23 gauge needle inserted in left antecubital.  Good blood return present. Procedure without incident.  Needle removed intact. Patient tolerated procedure well.

## 2013-01-26 ENCOUNTER — Ambulatory Visit: Payer: Medicare Other | Admitting: Gastroenterology

## 2013-01-27 ENCOUNTER — Ambulatory Visit (HOSPITAL_COMMUNITY): Payer: Medicare Other | Admitting: Oncology

## 2013-01-27 ENCOUNTER — Other Ambulatory Visit (HOSPITAL_COMMUNITY): Payer: Medicare Other

## 2013-01-28 NOTE — Consult Note (Signed)
NAME:  Mark Morrison, Mark Morrison NO.:  [id number]  MEDICAL RECORD NO.:  [id number]  LOCATION:  [facility]                         FACILITY:  [facility]  PHYSICIAN:  Barbaraann Barthel, M.D. DATE OF BIRTH:  1945-03-08  DATE OF CONSULTATION:  01/27/2013 DATE OF DISCHARGE:                                CONSULTATION   HISTORY OF PRESENT ILLNESS:  Neurosurgery was asked to see this 67 year old white male who has suspicious cervical adenopathy primarily on the right side.  This has been present for at least the last 2 or 3 months. He was referred for biopsy of the lymph node with the plan of obtaining enough tissue to send for flow cytometry.  PAST MEDICAL HISTORY:  He has history of pancytopenia secondary to a myelodysplastic syndrome and he has been seen by the Oncology Department for this with a history of anemia secondary to the above.  PAST SURGERIES:  A total colectomy for multiple polyps, this was done in 2004. He had left knee arthroscopic surgery when he was 67 years old. He had a TURP done in 2012.  He also still smokes at least half a pack of cigarettes per day.  He does not abuse alcohol.  MEDICATIONS:  For medications, see medication list.  ALLERGIES:  He has no known allergies.  PHYSICAL EXAMINATION:  VITAL SIGNS:  He is 5 feet 7 inches, weighs 135 pounds, temperature is 98.3, pulse is 103, respirations 14, blood pressure 100/60, O2 sat is 99%. HEENT:  Head is normocephalic.  Eyes, extraocular movements are intact. Pupils are round and reactive to light and accommodation.  He has multiple cervical palpable nodes primarily on the right side.  No bruits are appreciated.  No jugular vein distention.  Nose and oral mucosa are moist and conjunctiva are pallid. LUNGS:  He has diminished breath sounds bilaterally with some scattered rhonchi. HEART:  Regular rhythm, a slightly tacky probably from his anemia. ABDOMEN:  Soft.  He has no visceromegaly.  He has a previous  midline incision from his colectomy scar.  No obvious femoral or inguinal hernias are appreciated. EXTREMITIES:  The patient has drum stick type fingers. 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  REVIEW OF SYSTEMS:  No history of migraines, seizures or any lateralizing neurological findings.  ENDOCRINE:  No history of diabetes, thyroid disease, or adrenal problems.  CARDIOPULMONARY:  The patient has a history of tobacco abuse.  He has lung nodules which are being followed.  MUSCULOSKELETAL SYSTEM:  He has left knee arthroscopy surgery.  GI:  He has undergone an upper GI and a lower GI endoscopy. No history of hepatitis.  No history of constipation.  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  This has sometimes caused great problems with him. He at times would like to have his colostomy returned to him which he apparently had after his colon surgery.  This has been reversed and has healed well.  No recent history of bright  red rectal bleeding or melena. He has lost about 20 pounds, and he does still have frequent loose stools.  GU:  No history of frequency, dysuria, or kidney stones.  Review of history and physical therefore Mr. Ferron has multiple lymph nodes that are palpable.  I would suspect that these possibly with the findings of his drum stick type finger that possibly he has a pulmonary neoplasm.  At any rate, we will obtain a lymph node and obtain sufficient tissue so that flow cytometry can be obtained.  He will also be seen preoperatively by the Oncology Department for preoperative transfusions for his pancytopenia.  We will plan for surgery the following Thursday after his visit this coming Monday.     Barbaraann Barthel, M.D.     WB/MEDQ  D:  01/27/2013  T:  01/28/2013   Job:  [id number]

## 2013-01-30 ENCOUNTER — Other Ambulatory Visit: Payer: Self-pay

## 2013-01-30 ENCOUNTER — Encounter (HOSPITAL_BASED_OUTPATIENT_CLINIC_OR_DEPARTMENT_OTHER): Payer: Medicare Other

## 2013-01-30 ENCOUNTER — Encounter (HOSPITAL_COMMUNITY): Payer: Self-pay

## 2013-01-30 ENCOUNTER — Telehealth (HOSPITAL_COMMUNITY): Payer: Self-pay | Admitting: *Deleted

## 2013-01-30 ENCOUNTER — Encounter (HOSPITAL_COMMUNITY)
Admission: RE | Admit: 2013-01-30 | Discharge: 2013-01-30 | Disposition: A | Discharge status: Home / Self Care | Payer: Medicare Other | Source: Ambulatory Visit | Attending: General Surgery | Admitting: General Surgery

## 2013-01-30 DIAGNOSIS — D649 Anemia, unspecified: Secondary | ICD-10-CM

## 2013-01-30 LAB — CBC
Hemoglobin: 6.1 g/dL — CL (ref 13.0–17.0)
Hemoglobin: 5.8 g/dL — CL (ref 13.0–17.0)
MCH: 35.8 pg — ABNORMAL HIGH (ref 26.0–34.0)
MCHC: 31.2 g/dL (ref 30.0–36.0)
MCV: 114.4 fL — ABNORMAL HIGH (ref 78.0–100.0)
Platelets: 86 10*3/uL — ABNORMAL LOW (ref 150–400)
RBC: 1.6 MIL/uL — ABNORMAL LOW (ref 4.22–5.81)
RDW: 23.8 % — ABNORMAL HIGH (ref 11.5–15.5)
WBC: 1.9 10*3/uL — ABNORMAL LOW (ref 4.0–10.5)
WBC: 1.7 10*3/uL — ABNORMAL LOW (ref 4.0–10.5)

## 2013-01-30 LAB — BASIC METABOLIC PANEL
BUN: 11 mg/dL (ref 6–23)
CO2: 26 mEq/L (ref 19–32)
Chloride: 101 mEq/L (ref 96–112)
Creatinine, Ser: 0.82 mg/dL (ref 0.50–1.35)
GFR calc Af Amer: >90 mL/min (ref >90)
GFR calc non Af Amer: 88 mL/min — ABNORMAL LOW (ref >90)
Glucose, Bld: 122 mg/dL — ABNORMAL HIGH (ref 70–99)
Potassium: 3.8 mEq/L (ref 3.5–5.1)
Sodium: 135 mEq/L (ref 135–145)

## 2013-01-30 LAB — PREPARE RBC (CROSSMATCH)

## 2013-01-30 MED ORDER — SODIUM CHLORIDE 0.9 % IJ SOLN: 10.0000 mL | INTRAMUSCULAR | Status: DC | PRN

## 2013-01-30 MED ORDER — SODIUM CHLORIDE 0.9 % IV SOLN
250.0000 mL | Freq: Once | INTRAVENOUS | Status: AC
  Administered 2013-01-30: 250 mL via INTRAVENOUS

## 2013-01-30 NOTE — Telephone Encounter (Signed)
..  CRITICAL VALUE ALERT Critical value received:  hgb 6.1 Date of notification:  01/30/2013 Time of notification: 1015 Critical value read back:  yes Nurse who received alert:  TAR MD notified (1st page):  Patient here today for transfusion

## 2013-01-30 NOTE — Progress Notes (Signed)
01/30/13 1404  OBSTRUCTIVE SLEEP APNEA  Have you ever been diagnosed with sleep apnea through a sleep study? No  Do you snore loudly (loud enough to be heard through closed doors)?  1  Do you often feel tired, fatigued, or sleepy during the daytime? 1  Has anyone observed you stop breathing during your sleep? 0  Do you have, or are you being treated for high blood pressure? 1  BMI more than 35 kg/m2? 0  Age over 69 years old? 1  Neck circumference greater than 40 cm/18 inches? 0  Gender: 1  Obstructive Sleep Apnea Score 5  Score 4 or greater  Results sent to PCP

## 2013-01-30 NOTE — Patient Instructions (Addendum)
Mark Morrison  01/30/2013   Your procedure is scheduled on: Thursday, 02/02/13  Report to [facility] at 0830 AM.  Call this number if you have problems the morning of surgery: [phone]   Remember:   Do not eat food or drink liquids after midnight.   Take these medicines the morning of surgery with A SIP OF WATER: PROTONIX,   Do not wear jewelry, make-up or nail polish.  Do not wear lotions, powders, or perfumes. You may wear deodorant.  Do not shave 48 hours prior to surgery. Men may shave face and neck.  Do not bring valuables to the hospital.  [facility] is not responsible                  for any belongings or valuables.               Contacts, dentures or bridgework may not be worn into surgery.  Leave suitcase in the car. After surgery it may be brought to your room.  For patients admitted to the hospital, discharge time is determined by your                treatment team.               Patients discharged the day of surgery will not be allowed to drive  home.  Name and phone number of your driver: WIFE  Special Instructions: Shower using CHG 2 nights before surgery and the night before surgery.  If you shower the day of surgery use CHG.  Use special wash - you have one bottle of CHG for all showers.  You should use approximately 1/3 of the bottle for each shower.   Please read over the following fact sheets that you were given: Anesthesia Post-op Instructions and Care and Recovery After Surgery   Excision of Skin Lesions Excision of a skin lesion refers to the removal of a section of skin by making small cuts (incisions) in the skin. This is typically done to remove a cancerous growth (basal cell carcinoma, squamous cell carcinoma, or melanoma) or a noncancerous growth (cyst). It may be done to treat or prevent cancer or infection. It may also be done to improve cosmetic appearance (removal of mole, skin tag). LET YOUR CAREGIVER KNOW ABOUT:   Allergies to food or  medicine.  Medicines taken, including vitamins, herbs, eyedrops, over-the-counter medicines, and creams.  Use of steroids (by mouth or creams).  Previous problems with anesthetics or numbing medicines.  History of bleeding problems or blood clots.  History of any prostheses.  Previous surgery.  Other health problems, including diabetes and kidney problems.  Possibility of pregnancy, if this applies. RISKS AND COMPLICATIONS  Many complications can be managed. With appropriate treatment and rehabilitation, the following complications are very uncommon:  Bleeding.  Infection.  Scarring.  Recurrence of cyst or cancer.  Changes in skin sensation or appearance (discoloration, swelling).  Reaction to anesthesia.  Allergic reaction to surgical materials or ointments.  Damage to nerves, blood vessels, muscles, or other structures.  Continued pain. BEFORE THE PROCEDURE  It is important to follow your caregiver's instructions prior to your procedure to avoid complications. Steps before your procedure may include:  Physical exam, blood tests, other procedures, such as removing a small sample for examination under a microscope (biopsy).  Your caregiver may review the procedure, the anesthesia being used, and what to expect after the procedure with you. You may be asked to:  Stop  taking certain medicines, such as blood thinners (including aspirin, clopidogrel, ibuprofen), for several days prior to your procedure.  Take certain medicines.  Stop smoking. It is a good idea to arrange for a ride home after surgery and to have someone to help you with activities during recovery. PROCEDURE  There are several excision techniques. The type of excision or surgical technique used will depend on your condition, the location of the lesion, and your overall health. After the lesion is sterilized and a local anesthetic is applied, the following may be performed: Complete surgical  excision The area to be removed is marked with a pen. Using a small scalpel and scissors, the surgeon gently cuts around and under the lesion until it is completely removed. The lesion is placed in a special fluid and sent to the lab for examination. If necessary, bleeding will be controlled with a device that delivers heat. The edges of the wound are stitched together and a dressing is applied. This procedure may be performed to treat a cancerous growth or noncancerous cyst or lesion. Surgeons commonly perform an elliptical excision, to minimize scarring. Excision of a cyst The surgeon makes an incision on the cyst. The entire cyst is removed through the incision. The wound may be closed with a suture (stitch). Shave excision During shave excision, the surgeon uses a small blade or loop instrument to shave off the lesion. This may be done to remove a mole or skin tag. The wound is usually left to heal on its own without stitches. Punch excision During punch excision, the surgeon uses a small, round tool (like a cookie cutter) to cut a circle shape out of the skin. The outer edges of the skin are stitched together. This may be done to remove a mole or scar or to perform a biopsy of the lesion. Mohs micrographic surgery During Mohs micrographic surgery, layers of the lesion are removed with a scalpel or loop instrument and immediately examined under a microscope until all of the abnormal or cancerous tissue is removed. This procedure is minimally invasive and ensures the best cosmetic outcome, with removal of as little normal tissue as possible. Mohs is usually done to treat skin cancer, such as basal cell carcinoma or squamous cell carcinoma, particularly on the face and ears. Antibiotic ointment is applied to the surgical area after each of the procedures listed above, as necessary. AFTER THE PROCEDURE  How well you heal depends on many factors. Most patients heal quite well with proper techniques and  self-care. Scarring will lessen over time. HOME CARE INSTRUCTIONS   Take medicines for pain as directed.  Keep the incision area clean, dry, and protected for at least 48 hours. Change dressings as directed.  For bleeding, apply gentle but firm pressure to the wound using a folded towel for 20 minutes. Call your caregiver if bleeding does not stop.  Avoid high-impact exercise and activities until the stitches are removed or the area heals.  Follow your caregiver's instructions to minimize scarring. Avoid sun exposure until the area has healed. Scarring should lessen over time.  Follow up with your caregiver as directed. Removal of stitches within 4 to 14 days may be necessary. Finding out the results of your test Not all test results are available during your visit. If your test results are not back during the visit, make an appointment with your caregiver to find out the results. Do not assume everything is normal if you have not heard from your caregiver or  the medical facility. It is important for you to follow up on all of your test results. SEEK MEDICAL CARE IF:   You or your child has an oral temperature above 102 F (38.9 C).  You develop signs of infection (chills, feeling unwell).  You notice bleeding, pain, discharge, redness, or swelling at the incision site.  You notice skin irregularities or changes in sensation. MAKE SURE YOU:   Understand these instructions.  Will watch your condition.  Will get help right away if you are not doing well or get worse. FOR MORE INFORMATION  American Academy of [employer]icians: www.https://powers.com/ American Academy of Dermatology: InfoExam.si Document Released: 05/06/2009 Document Revised: 05/04/2011 Document Reviewed: 05/06/2009 [facility] Patient Information 2014 [place], [place]. Swollen Lymph Nodes The lymphatic system filters fluid from around cells. It is like a system of blood vessels. These channels carry lymph instead of blood. The  lymphatic system is an important part of the immune (disease fighting) system. When people talk about "swollen glands in the neck," they are usually talking about swollen lymph nodes. The lymph nodes are like the little traps for infection. You and your caregiver may be able to feel lymph nodes, especially swollen nodes, in these common areas: the groin (inguinal area), armpits (axilla), and above the clavicle (supraclavicular). You may also feel them in the neck (cervical) and the back of the head just above the hairline (occipital). Swollen glands occur when there is any condition in which the body responds with an allergic type of reaction. For instance, the glands in the neck can become swollen from insect bites or any type of minor infection on the head. These are very noticeable in children with only minor problems. Lymph nodes may also become swollen when there is a tumor or problem with the lymphatic system, such as Hodgkin's disease. TREATMENT   Most swollen glands do not require treatment. They can be observed (watched) for a short period of time, if your caregiver feels it is necessary. Most of the time, observation is not necessary.  Antibiotics (medicines that kill germs) may be prescribed by your caregiver. Your caregiver may prescribe these if he or she feels the swollen glands are due to a bacterial (germ) infection. Antibiotics are not used if the swollen glands are caused by a virus. HOME CARE INSTRUCTIONS   Take medications as directed by your caregiver. Only take over-the-counter or prescription medicines for pain, discomfort, or fever as directed by your caregiver. SEEK MEDICAL CARE IF:   If you begin to run a temperature greater than 102 F (38.9 C), or as your caregiver suggests. MAKE SURE YOU:   Understand these instructions.  Will watch your condition.  Will get help right away if you are not doing well or get worse. Document Released: 01/30/2002 Document Revised:  05/04/2011 Document Reviewed: 02/09/2005 [facility] Patient Information 2014 [place], [place]. PATIENT INSTRUCTIONS POST-ANESTHESIA  IMMEDIATELY FOLLOWING SURGERY:  Do not drive or operate machinery for the first twenty four hours after surgery.  Do not make any important decisions for twenty four hours after surgery or while taking narcotic pain medications or sedatives.  If you develop intractable nausea and vomiting or a severe headache please notify your doctor immediately.  FOLLOW-UP:  Please make an appointment with your surgeon as instructed. You do not need to follow up with anesthesia unless specifically instructed to do so.  WOUND CARE INSTRUCTIONS (if applicable):  Keep a dry clean dressing on the anesthesia/puncture wound site if there is drainage.  Once  the wound has quit draining you may leave it open to air.  Generally you should leave the bandage intact for twenty four hours unless there is drainage.  If the epidural site drains for more than 36-48 hours please call the anesthesia department.  QUESTIONS?:  Please feel free to call your physician or the hospital operator if you have any questions, and they will be happy to assist you.

## 2013-01-30 NOTE — Progress Notes (Signed)
Informed by blood bank that blood would not be available for transfusion until tomorrow. Pt scheduled tomorrow to return for transfusion 2 units.  IV access flushed, protected with kling, and left in place for appt tomorrow.

## 2013-01-31 ENCOUNTER — Encounter (HOSPITAL_BASED_OUTPATIENT_CLINIC_OR_DEPARTMENT_OTHER): Payer: Medicare Other

## 2013-01-31 VITALS — BP 105/61 | HR 92 | Temp 98.3°F | Resp 16

## 2013-01-31 DIAGNOSIS — D649 Anemia, unspecified: Secondary | ICD-10-CM

## 2013-01-31 MED ORDER — SODIUM CHLORIDE 0.9 % IV SOLN
250.0000 mL | Freq: Once | INTRAVENOUS | Status: AC
  Administered 2013-01-31: 250 mL via INTRAVENOUS

## 2013-01-31 MED ORDER — SODIUM CHLORIDE 0.9 % IJ SOLN
10.0000 mL | INTRAMUSCULAR | Status: AC | PRN
  Administered 2013-01-31: 10 mL

## 2013-01-31 NOTE — Progress Notes (Signed)
Pt. Tolerated transfusion very well.  D/C home accompanied by spouse.

## 2013-02-01 LAB — TYPE AND SCREEN
ABO/RH(D): A POS
Antibody Screen: POSITIVE
DAT, IgG: POSITIVE
Unit division: 0

## 2013-02-02 ENCOUNTER — Ambulatory Visit (HOSPITAL_COMMUNITY)
Admission: RE | Admit: 2013-02-02 | Discharge: 2013-02-02 | Disposition: A | Discharge status: Home / Self Care | Payer: Medicare Other | Source: Ambulatory Visit | Attending: General Surgery | Admitting: General Surgery

## 2013-02-02 ENCOUNTER — Encounter (HOSPITAL_COMMUNITY)
Admission: RE | Disposition: A | Discharge status: Home / Self Care | Payer: Self-pay | Source: Ambulatory Visit | Attending: General Surgery

## 2013-02-02 ENCOUNTER — Encounter (HOSPITAL_COMMUNITY): Payer: Medicare Other | Admitting: Certified Registered"

## 2013-02-02 ENCOUNTER — Encounter (HOSPITAL_COMMUNITY): Payer: Self-pay | Admitting: *Deleted

## 2013-02-02 ENCOUNTER — Ambulatory Visit (HOSPITAL_COMMUNITY): Payer: Medicare Other | Admitting: Certified Registered"

## 2013-02-02 DIAGNOSIS — R918 Other nonspecific abnormal finding of lung field: Secondary | ICD-10-CM

## 2013-02-02 DIAGNOSIS — D126 Benign neoplasm of colon, unspecified: Secondary | ICD-10-CM

## 2013-02-02 DIAGNOSIS — D696 Thrombocytopenia, unspecified: Secondary | ICD-10-CM

## 2013-02-02 DIAGNOSIS — K91850 Pouchitis: Secondary | ICD-10-CM

## 2013-02-02 DIAGNOSIS — K2270 Barrett's esophagus without dysplasia: Secondary | ICD-10-CM

## 2013-02-02 DIAGNOSIS — Z86718 Personal history of other venous thrombosis and embolism: Secondary | ICD-10-CM

## 2013-02-02 DIAGNOSIS — R195 Other fecal abnormalities: Secondary | ICD-10-CM

## 2013-02-02 DIAGNOSIS — D61818 Other pancytopenia: Secondary | ICD-10-CM

## 2013-02-02 DIAGNOSIS — F17200 Nicotine dependence, unspecified, uncomplicated: Secondary | ICD-10-CM

## 2013-02-02 DIAGNOSIS — K5000 Crohn's disease of small intestine without complications: Secondary | ICD-10-CM

## 2013-02-02 DIAGNOSIS — Z87898 Personal history of other specified conditions: Secondary | ICD-10-CM

## 2013-02-02 DIAGNOSIS — C8409 Mycosis fungoides, extranodal and solid organ sites: Secondary | ICD-10-CM | POA: Insufficient documentation

## 2013-02-02 DIAGNOSIS — D649 Anemia, unspecified: Secondary | ICD-10-CM

## 2013-02-02 DIAGNOSIS — D469 Myelodysplastic syndrome, unspecified: Secondary | ICD-10-CM

## 2013-02-02 DIAGNOSIS — D72819 Decreased white blood cell count, unspecified: Secondary | ICD-10-CM

## 2013-02-02 DIAGNOSIS — R599 Enlarged lymph nodes, unspecified: Secondary | ICD-10-CM | POA: Insufficient documentation

## 2013-02-02 DIAGNOSIS — K5190 Ulcerative colitis, unspecified, without complications: Secondary | ICD-10-CM

## 2013-02-02 DIAGNOSIS — R197 Diarrhea, unspecified: Secondary | ICD-10-CM

## 2013-02-02 HISTORY — PX: MASS EXCISION: SHX2000

## 2013-02-02 LAB — CBC
Hemoglobin: 8 g/dL — ABNORMAL LOW (ref 13.0–17.0)
MCH: 34.5 pg — ABNORMAL HIGH (ref 26.0–34.0)
Platelets: 99 10*3/uL — ABNORMAL LOW (ref 150–400)
RBC: 2.32 MIL/uL — ABNORMAL LOW (ref 4.22–5.81)
RDW: 24.3 % — ABNORMAL HIGH (ref 11.5–15.5)
WBC: 2.1 10*3/uL — ABNORMAL LOW (ref 4.0–10.5)

## 2013-02-02 SURGERY — EXCISION MASS
Anesthesia: Monitor Anesthesia Care | Site: Neck | Laterality: Right

## 2013-02-02 MED ORDER — 0.9 % SODIUM CHLORIDE (POUR BTL) OPTIME
TOPICAL | Status: DC | PRN
  Administered 2013-02-02: 1000 mL

## 2013-02-02 MED ORDER — STERILE WATER FOR IRRIGATION IR SOLN
Status: DC | PRN
  Administered 2013-02-02: 1000 mL

## 2013-02-02 MED ORDER — PHENYLEPHRINE HCL 10 MG/ML IJ SOLN
INTRAMUSCULAR | Status: DC | PRN
  Administered 2013-02-02 (×3): 100 ug via INTRAVENOUS

## 2013-02-02 MED ORDER — LACTATED RINGERS IV SOLN
INTRAVENOUS | Status: DC
  Administered 2013-02-02: 11:00:00 via INTRAVENOUS

## 2013-02-02 MED ORDER — PROPOFOL 10 MG/ML IV BOLUS
INTRAVENOUS | Status: AC
  Filled 2013-02-02: qty 20

## 2013-02-02 MED ORDER — BACITRACIN-NEOMYCIN-POLYMYXIN 400-5-5000 EX OINT
TOPICAL_OINTMENT | CUTANEOUS | Status: AC
  Filled 2013-02-02: qty 1

## 2013-02-02 MED ORDER — MIDAZOLAM HCL 2 MG/2ML IJ SOLN
INTRAMUSCULAR | Status: AC
  Filled 2013-02-02: qty 2

## 2013-02-02 MED ORDER — FENTANYL CITRATE 0.05 MG/ML IJ SOLN
INTRAMUSCULAR | Status: AC
  Filled 2013-02-02: qty 2

## 2013-02-02 MED ORDER — PROPOFOL 10 MG/ML IV BOLUS
INTRAVENOUS | Status: DC | PRN
  Administered 2013-02-02: 30 mg via INTRAVENOUS
  Administered 2013-02-02 (×8): 10 mg via INTRAVENOUS

## 2013-02-02 MED ORDER — CEFAZOLIN SODIUM 1-5 GM-% IV SOLN
INTRAVENOUS | Status: DC | PRN
  Administered 2013-02-02: 2 g via INTRAVENOUS

## 2013-02-02 MED ORDER — ONDANSETRON HCL 4 MG/2ML IJ SOLN
4.0000 mg | Freq: Once | INTRAMUSCULAR | Status: AC
  Administered 2013-02-02: 4 mg via INTRAVENOUS

## 2013-02-02 MED ORDER — FENTANYL CITRATE 0.05 MG/ML IJ SOLN
25.0000 ug | INTRAMUSCULAR | Status: AC
  Administered 2013-02-02 (×2): 25 ug via INTRAVENOUS

## 2013-02-02 MED ORDER — ONDANSETRON HCL 4 MG/2ML IJ SOLN
INTRAMUSCULAR | Status: AC
  Filled 2013-02-02: qty 2

## 2013-02-02 MED ORDER — CEFAZOLIN SODIUM 1 G IJ SOLR
INTRAMUSCULAR | Status: AC
  Filled 2013-02-02: qty 10

## 2013-02-02 MED ORDER — MIDAZOLAM HCL 2 MG/2ML IJ SOLN
1.0000 mg | INTRAMUSCULAR | Status: DC | PRN
  Administered 2013-02-02: 2 mg via INTRAVENOUS

## 2013-02-02 MED ORDER — LIDOCAINE HCL (PF) 1 % IJ SOLN
INTRAMUSCULAR | Status: AC
  Filled 2013-02-02: qty 30

## 2013-02-02 MED ORDER — BACITRACIN-NEOMYCIN-POLYMYXIN 400-5-5000 EX OINT
TOPICAL_OINTMENT | CUTANEOUS | Status: DC | PRN
  Administered 2013-02-02: 1 via TOPICAL

## 2013-02-02 MED ORDER — LACTATED RINGERS IV SOLN
INTRAVENOUS | Status: DC | PRN
  Administered 2013-02-02 (×2): via INTRAVENOUS

## 2013-02-02 MED ORDER — LIDOCAINE HCL (PF) 1 % IJ SOLN
INTRAMUSCULAR | Status: DC | PRN
  Administered 2013-02-02: 7 mL

## 2013-02-02 SURGICAL SUPPLY — 44 items
ATTRACTOMAT 16X20 MAGNETIC DRP (DRAPES) ×1 IMPLANT
BAG HAMPER (MISCELLANEOUS) ×2 IMPLANT
CLEANER TIP ELECTROSURG 2X2 (MISCELLANEOUS) ×1 IMPLANT
CLOTH BEACON ORANGE TIMEOUT ST (SAFETY) ×2 IMPLANT
COVER LIGHT HANDLE STERIS (MISCELLANEOUS) ×5 IMPLANT
DECANTER SPIKE VIAL GLASS SM (MISCELLANEOUS) ×1 IMPLANT
DRSG TEGADERM 2-3/8X2-3/4 SM (GAUZE/BANDAGES/DRESSINGS) ×2 IMPLANT
ELECT NDL TIP 2.8 STRL (NEEDLE) IMPLANT
ELECT NEEDLE TIP 2.8 STRL (NEEDLE) ×2 IMPLANT
ELECT REM PT RETURN 9FT ADLT (ELECTROSURGICAL) ×2
ELECTRODE REM PT RTRN 9FT ADLT (ELECTROSURGICAL) ×1 IMPLANT
GLOVE BIOGEL PI IND STRL 7.0 (GLOVE) IMPLANT
GLOVE BIOGEL PI IND STRL 7.5 (GLOVE) IMPLANT
GLOVE BIOGEL PI INDICATOR 7.0 (GLOVE) ×2
GLOVE BIOGEL PI INDICATOR 7.5 (GLOVE) ×1
GLOVE ECLIPSE 6.5 STRL STRAW (GLOVE) ×1 IMPLANT
GLOVE ECLIPSE 7.0 STRL STRAW (GLOVE) ×1 IMPLANT
GLOVE EXAM NITRILE LRG STRL (GLOVE) ×1 IMPLANT
GLOVE SKINSENSE NS SZ7.0 (GLOVE) ×1
GLOVE SKINSENSE STRL SZ7.0 (GLOVE) ×1 IMPLANT
GOWN STRL REIN XL XLG (GOWN DISPOSABLE) ×7 IMPLANT
KIT ROOM TURNOVER APOR (KITS) ×2 IMPLANT
MANIFOLD NEPTUNE II (INSTRUMENTS) ×2 IMPLANT
NDL HYPO 25X5/8 SAFETYGLIDE (NEEDLE) IMPLANT
NEEDLE HYPO 25X5/8 SAFETYGLIDE (NEEDLE) ×2 IMPLANT
NS IRRIG 1000ML POUR BTL (IV SOLUTION) ×2 IMPLANT
PACK MINOR (CUSTOM PROCEDURE TRAY) ×1 IMPLANT
PAD ARMBOARD 7.5X6 YLW CONV (MISCELLANEOUS) ×2 IMPLANT
SET BASIN LINEN APH (SET/KITS/TRAYS/PACK) ×2 IMPLANT
SPONGE GAUZE 2X2 8PLY STRL LF (GAUZE/BANDAGES/DRESSINGS) ×1 IMPLANT
SPONGE GAUZE 4X4 12PLY (GAUZE/BANDAGES/DRESSINGS) ×2 IMPLANT
STRIP CLOSURE SKIN 1/2X4 (GAUZE/BANDAGES/DRESSINGS) ×1 IMPLANT
STRIP CLOSURE SKIN 1/8X3 (GAUZE/BANDAGES/DRESSINGS) ×1 IMPLANT
SUT SILK 2 0 (SUTURE) ×2
SUT SILK 2-0 18XBRD TIE 12 (SUTURE) IMPLANT
SUT SILK 4 0 (SUTURE) ×2
SUT SILK 4-0 18XBRD TIE 12 (SUTURE) IMPLANT
SUT VIC AB 4-0 PS2 27 (SUTURE) ×1 IMPLANT
SUT VIC AB 5-0 P-3 18X BRD (SUTURE) IMPLANT
SUT VIC AB 5-0 P3 18 (SUTURE) ×2
SYR BULB IRRIGATION 50ML (SYRINGE) ×1 IMPLANT
SYR CONTROL 10ML LL (SYRINGE) ×1 IMPLANT
TOWEL OR 17X26 4PK STRL BLUE (TOWEL DISPOSABLE) ×2 IMPLANT
WATER STERILE IRR 1000ML POUR (IV SOLUTION) ×4 IMPLANT

## 2013-02-02 NOTE — Progress Notes (Signed)
70 yr. Old W. Male with pancytopenia for excisional biopsy of Right post triangle lymph node.  The specimen wiill be sent as a rush and as a fresh specimen.  Procedure and risks explained to family and informed consent obtained.  Labs reviewed.  Pt received transfusion with irradiated blood preop and follow up arrangements with hematology service will be arranged.  No clinical change since H&P, dict # [id number].  There were no vitals filed for this visit.temp 97.8, HR 96, BP 102/67, resp 16, O2 sat 96%

## 2013-02-02 NOTE — Addendum Note (Signed)
Addendum created 02/02/13 1244 by Ervin Knack, CRNA   Modules edited: Anesthesia Medication Administration

## 2013-02-02 NOTE — Brief Op Note (Signed)
02/02/2013  12:14 PM  PATIENT:  Mark Morrison  67 y.o. male  PRE-OPERATIVE DIAGNOSIS:  mass right neck  POST-OPERATIVE DIAGNOSIS:  cervical lymphadenopathy  PROCEDURE:  Procedure(s): EXCISION CERVICAL LYMPHNODES RIGHT NECK (Right)  SURGEON:  Surgeon(s) and Role:    * Marlane Hatcher, MD - Primary  PHYSICIAN ASSISTANT:   ASSISTANTS: none   ANESTHESIA:   IV sedation  EBL:  Total I/O In: 1000 [I.V.:1000] Out: -   BLOOD ADMINISTERED:none  DRAINS: none   LOCAL MEDICATIONS USED:  XYLOCAINE  1 %  ~ 7 cc.  SPECIMEN:  Source of Specimen:  Post triangle Right neck cervical lymph node.  DISPOSITION OF SPECIMEN:  PATHOLOGY  COUNTS:  YES  TOURNIQUET:  * No tourniquets in log *  DICTATION: .Other Dictation: Dictation Number OR dict # [id number].  PLAN OF CARE: Discharge to home after PACU  PATIENT DISPOSITION:  PACU - hemodynamically stable.   Delay start of Pharmacological VTE agent (>24hrs) due to surgical blood loss or risk of bleeding: not applicable

## 2013-02-02 NOTE — Anesthesia Preprocedure Evaluation (Signed)
Anesthesia Evaluation  Patient identified by MRN, date of birth, ID band Patient awake    Reviewed: Allergy & Precautions, H&P , NPO status , Patient's Chart, lab work & pertinent test results  History of Anesthesia Complications Negative for: history of anesthetic complications  Airway Mallampati: I      Dental  (+) Edentulous Upper and Edentulous Lower   Pulmonary shortness of breath, COPDCurrent Smoker,    Pulmonary exam normal       Cardiovascular hypertension (no meds now), Pt. on medications DVT Rhythm:Regular Rate:Normal  DVT's after APR surgery   Neuro/Psych PSYCHIATRIC DISORDERS Anxiety    GI/Hepatic PUD, GERD-  Medicated and Controlled,  Endo/Other    Renal/GU      Musculoskeletal   Abdominal   Peds  Hematology  (+) Blood dyscrasia, anemia ,   Anesthesia Other Findings Rec'd 3 PRBC prior to surgery.  Reproductive/Obstetrics                           Anesthesia Physical Anesthesia Plan  ASA: III  Anesthesia Plan: MAC   Post-op Pain Management:    Induction: Intravenous  Airway Management Planned: Simple Face Mask  Additional Equipment:   Intra-op Plan:   Post-operative Plan:   Informed Consent: I have reviewed the patients History and Physical, chart, labs and discussed the procedure including the risks, benefits and alternatives for the proposed anesthesia with the patient or authorized representative who has indicated his/her understanding and acceptance.     Plan Discussed with:   Anesthesia Plan Comments:         Anesthesia Quick Evaluation

## 2013-02-02 NOTE — Transfer of Care (Signed)
Immediate Anesthesia Transfer of Care Note  Patient: Mark Morrison  Procedure(s) Performed: Procedure(s): EXCISION CERVICAL LYMPHNODES RIGHT NECK (Right)  Patient Location: PACU  Anesthesia Type:MAC  Level of Consciousness: awake  Airway & Oxygen Therapy: Patient Spontanous Breathing and Patient connected to nasal cannula oxygen  Post-op Assessment: Report given to PACU RN and Post -op Vital signs reviewed and stable  Post vital signs: Reviewed and stable  Complications: No apparent anesthesia complications

## 2013-02-02 NOTE — Anesthesia Postprocedure Evaluation (Signed)
  Anesthesia Post-op Note  Patient: Mark Morrison  Procedure(s) Performed: Procedure(s): EXCISION CERVICAL LYMPHNODES RIGHT NECK (Right)  Patient Location: PACU  Anesthesia Type:MAC  Level of Consciousness: awake, alert  and oriented  Airway and Oxygen Therapy: Patient Spontanous Breathing and Patient connected to nasal cannula oxygen  Post-op Pain: none  Post-op Assessment: Post-op Vital signs reviewed, Patient's Cardiovascular Status Stable, Respiratory Function Stable, Patent Airway and No signs of Nausea or vomiting  Post-op Vital Signs: Reviewed and stable  Complications: No apparent anesthesia complications

## 2013-02-02 NOTE — Preoperative (Signed)
Beta Blockers   Reason not to administer Beta Blockers:Not Applicable 

## 2013-02-03 ENCOUNTER — Encounter (HOSPITAL_COMMUNITY): Payer: Self-pay | Admitting: General Surgery

## 2013-02-03 NOTE — Op Note (Signed)
NAME:  Mark Morrison, Mark Morrison NO.:  [id number]  MEDICAL RECORD NO.:  [id number]  LOCATION:  [facility]                          FACILITY:  [facility]  PHYSICIAN:  Barbaraann Barthel, M.D. DATE OF BIRTH:  1945/08/30  DATE OF PROCEDURE:  02/02/2013 DATE OF DISCHARGE:  02/02/2013                              OPERATIVE REPORT   DIAGNOSIS:  Right cervical adenopathy with pancytopenia.  PROCEDURE:  Excisional biopsy of right cervical lymph node of the posterior triangle, right neck.  WOUND CLASSIFICATION:  Clean.  FINDINGS:  Gross operative findings thus consistent with an enlarged cervical node.  We discussed the handling of the lymph node with the pathology department.  This will be sent in saline as a fresh specimen and be sent rushed.  Results will be conveyed to the Oncology/Hematology Department.  Gross operative findings, the patient had lymph nodes one more in the middle of the neck on the right side.  This is the one that I biopsied, it is just on the border actually the posterior triangle of the neck and one clearly at posterior triangle neck lymph node.  I biopsied the one that appeared to be more anterior and more mobile.  This was approximately the size of a nickel.  TECHNIQUE:  The patient was placed in the left lateral decubitus position after prepping with Betadine solution and draping in the usual manner.  I used approximately 7 mL of 1% Xylocaine without epinephrine, and I anesthetized the area around this lymph node.  He was sedated with IV sedation, and I did a longitudinal excision over the palpated lymph node, which I had previously marked with a sterile marking pen.  We dissected this free.  There was minimal bleeding.  The vascular pedicle lymph node was ligated with 4-0 silk suture.  I irrigated with normal saline solution and I closed the wound with interrupted 4-0 Polysorb with the subcutaneous layer and a running 5-0 Polysorb for the subcuticular layer.   Half-inch Steri-Strips and 2 x 2 with Neosporin and Tegaderm dressing was applied.  Prior to closure, all sponge, needle, and instrument counts were found to be correct.  Estimated blood loss was less than 5 mL.  The patient received a 1 L of crystalloids intraoperatively.  No drains were placed and there were no complications.    Barbaraann Barthel, M.D.    WB/MEDQ  D:  02/02/2013  T:  02/03/2013  Job:  [id number]

## 2013-02-05 NOTE — Progress Notes (Addendum)
Mark Moh, MD [address] [place] [address]  MDS (myelodysplastic syndrome) - Plan: CBC with Differential, Ferritin, darbepoetin ([name]) injection 300 mcg, CBC with Differential, Ferritin  CURRENT THERAPY: Work-up for lymphadenopathy  INTERVAL HISTORY: Mark Morrison 67 y.o. 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   Mark Morrison underwent a right cervical neck lymph node excisional biopsy for a diagnosis of his recent clinical change of finding lymph nodes on the right. Pathology is still pending from this surgical excision that was performed by Dr. Malvin Johns on 02/03/2013.  He was told that the results would be back this AM, but they are not yet reported.   I personally reviewed and went over laboratory results with the patient.  The labs follow below.   I personally reviewed and went over radiographic studies with the patient.  The full reports follow below.  We will perform lab work today and see where we stand from a hematologic standpoint and transfuse as needed.    Past Medical History  Diagnosis Date  . Ulcerative colitis 2004    with IPPA  . 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 History of DVT (deep vein thrombosis)     and PE  . Barrett's esophagus 02/2010    on EGD  . Vitamin B 12 deficiency   . BPH (benign prostatic hyperplasia)   . GERD (gastroesophageal reflux disease)   . Pulmonary nodules 04/01/2011  . Blood dyscrasia     dms,    per Dr. Leonia Corona  . COPD (chronic obstructive pulmonary disease)     emphasema,   xray done and 4 small areas found on test  . 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    Marland Kitchen Shortness of breath     with exertion  . Anxiety     has COLONIC POLYPS; ANEMIA;  THROMBOCYTOPENIA; LEUKOPENIA, MILD; TOBACCO ABUSE; ULCERATIVE COLITIS; Pouchitis; DIARRHEA; PULMONARY EMBOLISM, HX OF; BENIGN PROSTATIC HYPERTROPHY, HX OF; BARRETTS ESOPHAGUS; Pancytopenia; Pulmonary nodules; MDS (myelodysplastic syndrome); Heme positive stool; and Crohn's ileitis on his problem list.     has No Known Allergies.  Mark Morrison had no medications administered during this visit.  Past Surgical History  Procedure Laterality Date  . Knee surgery      Left  . Abdominoperineal proctocolectomy  2004    with ileal anastomosis and anal pouch creation at [facility]  . Colonoscopy  11/22/2006    ULCERATIVE COLITIS  . Transurethral resection of prostate  02/03/2011    Procedure: TRANSURETHRAL RESECTION OF THE PROSTATE (TURP);  Surgeon: Ky Barban;  Location: [facility];  Service: Urology;  Laterality: N/A;  . Ileoscopy  03/14/2010    geographic ulceratios of the ileal pouch c/w pouchitis, more proximal distal ileum mucosa normal, biospy from ulcer benign  . Esophagogastroduodenoscopy  03/14/10    barret's esophagus, small hh, erosive duodenitis  . Colectomy    . Esophagogastroduodenoscopy  05/29/2011    SLF: Barrett's esophagus  6-7 CM SEGNMENT/ Moderate gastritis/ MILD Duodenitis/ MODERATE Hiatal hernia, Surveillance due 2016  . Flexible sigmoidoscopy N/A 11/11/2012    MV[facility]:QIONGE IN THE SMALL BOWEL AND POUCH ANAL CANAL STENOSED TO 10 cm  . Mass excision Right 02/02/2013    Procedure: EXCISION CERVICAL LYMPHNODES RIGHT NECK;  Surgeon: Vilinda Blanks  Malvin Johns, MD;  Location: [facility];  Service: General;  Laterality: Right;    Denies any headaches, dizziness, double vision, fevers, chills, night sweats, nausea, vomiting, diarrhea, constipation, chest pain, heart palpitations, shortness of breath, blood in stool, black tarry stool, urinary pain, urinary burning, urinary frequency, hematuria.   PHYSICAL EXAMINATION  ECOG PERFORMANCE STATUS: 1 - Symptomatic but completely ambulatory  Filed Vitals:     02/06/13 0900  BP: 108/68  Pulse: 106  Temp: 97.8 F (36.6 C)  Resp: 16    GENERAL:alert, no distress, well nourished, well developed and tired SKIN: skin color, texture, turgor are normal, no rashes or significant lesions HEAD: Normocephalic, No masses, lesions, tenderness or abnormalities EYES: normal, PERRLA, EOMI, Conjunctiva are pink and non-injected EARS: External ears normal OROPHARYNX:mucous membranes are moist  NECK: supple, trachea midline, Right anterior cervical chain lymph node measuring 2 x 2 cm in size with additional sub-centimeter lymph nodes in the right supraclavicular region (3).  S/P right anterior cervical chain excision with steri-strips in place.  BREAST:not examined LUNGS: clear to auscultation and percussion HEART: regular rate & rhythm, no murmurs and no gallops ABDOMEN:not examined BACK: Back symmetric, no curvature. EXTREMITIES:less then 2 second capillary refill, no joint deformities, effusion, or inflammation, no skin discoloration  NEURO: alert & oriented x 3 with fluent speech, no focal motor/sensory deficits, gait normal    LABORATORY DATA: CBC    Component Value Date/Time   WBC 2.1* 02/02/2013 0840   RBC 2.32* 02/02/2013 0840   RBC 2.37* 10/25/2012 0957   HGB 8.0* 02/02/2013 0840   HCT 24.3* 02/02/2013 0840   PLT 99* 02/02/2013 0840   MCV 104.7* 02/02/2013 0840   MCH 34.5* 02/02/2013 0840   MCHC 32.9 02/02/2013 0840   RDW 24.3* 02/02/2013 0840   LYMPHSABS 0.5* 01/13/2013 0921   MONOABS 0.5 01/13/2013 0921   EOSABS 0.0 01/13/2013 0921   BASOSABS 0.0 01/13/2013 0921      Chemistry      Component Value Date/Time   NA 135 01/30/2013 1410   K 3.8 01/30/2013 1410   CL 101 01/30/2013 1410   CO2 26 01/30/2013 1410   BUN 11 01/30/2013 1410   CREATININE 0.82 01/30/2013 1410   CREATININE 0.84 12/08/2012 1109      Component Value Date/Time   CALCIUM 8.2* 01/30/2013 1410   ALKPHOS 125* 01/13/2013 0921   AST 20 01/13/2013 0921   ALT 17  01/13/2013 0921   BILITOT 2.0* 01/13/2013 0921       RADIOGRAPHIC STUDIES:  01/23/2013  CLINICAL DATA: Lymphadenopathy. Myelodysplastic syndrome.  EXAM:  CT NECK WITH CONTRAST  TECHNIQUE:  Multidetector CT imaging of the neck was performed using the  standard protocol following the bolus administration of intravenous  contrast.  CONTRAST: OMNIPAQUE IOHEXOL 300 MG/ML SOLN  COMPARISON: None.  FINDINGS:  No focal mucosal or submucosal lesions are present. The vocal cords  are midline and symmetric. The tongue musculature is unremarkable.  An enlarged posterior right level 2 lymph node measures 16 x 12 x 19  mm. Multiple right posterior triangle lymph nodes are evident. The  largest measures 14.5 x 13 x 13 mm. Smaller supraclavicular lymph  nodes are present on the right.  No significant left-sided adenopathy is present.  The soft tissues of the neck are otherwise unremarkable. The thyroid  is within normal limits.  The lung apices are clear.  The bone windows demonstrate mild degenerative changes, most evident  at C3-4 with bilateral uncovertebral and facet spurring.  IMPRESSION:  Limited  1. Enlarged right level 2 and posterior triangle lymph nodes as  described. These nodes are rounded and appear pathologic.  2. No significant left-sided adenopathy.  3. Mild spondylosis of the cervical spine is most evident at C3-4.  Electronically Signed  By: Gennette Pac M.D.  On: 01/23/2013 10:20    01/23/2013  CLINICAL DATA: Followup adenopathy.  EXAM:  CT CHEST, ABDOMEN, AND PELVIS WITH CONTRAST  TECHNIQUE:  Multidetector CT imaging of the chest, abdomen and pelvis was  performed following the standard protocol during bolus  administration of intravenous contrast.  CONTRAST: OMNIPAQUE IOHEXOL 300 MG/ML SOLN  COMPARISON: CT chest 09/23/2012, CT abdomen and pelvis 09/23/2012  FINDINGS:  CT CHEST FINDINGS  No pleural effusion identified. There is no airspace  consolidation  or atelectasis noted. Tiny nodule within the left upper lobe is  unchanged measuring 2-3 mm, image 41/series 3. There has been  resolution of previously noted nodules in the posterior right lower  lobe, image 42/series 3. No new or enlarging nodules or mass is  identified. The trachea appears patent and is midline. The heart  size appears normal. There is a right paratracheal lymph node  measuring 1.1 cm, image 24/ series 2. Previously this measured 0.5  cm. No enlarged hilar lymph nodes. Calcified atherosclerotic change  involves the thoracic aorta of. Calcifications involving the LAD  Coronary artery also noted. No enlarged axillary or supraclavicular  lymph nodes.  CT ABDOMEN AND PELVIS FINDINGS  No focal liver abnormality identified. Gallstones are identified  measuring up to 6 mm, image 71/series 2. No biliary dilatation. The  pancreas is unremarkable. The spleen measures 15 cm in length. This  is compared with 14 cm on 09/23/2012. Stable low attenuation  structure within the spleen measuring 1.3 cm, image 74/ series 2.  There is a right renal cyst which measures 2.5 cm, image 62/series  2. There is marked enlargement of the prostate gland which has a  transverse diameter of 8 cm, image 112/ series 2. Calcifications  within the urinary bladder are identified.  Upper abdominal adenopathy is again identified. Periaortic lymph  node measures 2.3 cm, image number 74/ series 2. This is compared  with 2.0 cm previously. Just above the bifurcation of the abdominal  aorta there is a 1.7 cm periaortic lymph node, image 84/ series 2.  Previously 1.9 cm. Multiple enlarged left iliac lymph nodes are  identified. Index left external iliac node measures 3 cm, image  108/series 2. Previously 2.3 cm.  There is no free fluid or abnormal fluid collections identified  within the abdomen or pelvis. The stomach is normal. The small bowel  loops have a normal course and caliber.  Postoperative change from  prior colectomy appears stable.  Review of the visualized osseous structures is significant for  lumbar degenerative disc disease. No aggressive lytic or sclerotic  bone lesions identified.  IMPRESSION:  Chest CT:  1. Borderline enlarged right paratracheal lymph node is increased in  size from 09/28/2011.  2. Stable 2-3 mm nodule in the lingular portion of the left lung.  3. Resolution of previous right lower lobe nodules  4. Atherosclerotic disease including coronary artery calcifications  Abdomen CT:  1. Persistent abdominal and pelvic adenopathy. This is stable to  slightly increased in the interval.  2. Splenomegaly.  3. Prostate gland enlargement.  4. Gallstones.  Electronically Signed  By: Signa Kell M.D.  On: 01/23/2013 09:44    PATHOLOGY:    ASSESSMENT:  1. 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 2. Right cervical lymphadenopathy, S/P excisional lymph node biopsy by Dr. Malvin Johns on  02/03/2013. 3. Right supraclavicular lymphadenopathy. 4. Transfusion dependency  Patient Active Problem List   Diagnosis Date Noted  . Crohn's ileitis 12/20/2012  . Heme positive stool 10/13/2012  . MDS (myelodysplastic syndrome) 01/01/2012  . Pulmonary nodules 04/01/2011  . Pancytopenia 02/23/2011  . BARRETTS ESOPHAGUS 04/22/2010  . Pouchitis 09/18/2008  . TOBACCO ABUSE 10/27/2007  . COLONIC POLYPS 10/25/2007  . ANEMIA 10/25/2007  . THROMBOCYTOPENIA 10/25/2007  . LEUKOPENIA, MILD 10/25/2007  . ULCERATIVE COLITIS 10/25/2007  . DIARRHEA 10/25/2007  . PULMONARY EMBOLISM, HX OF 10/25/2007  . BENIGN PROSTATIC HYPERTROPHY, HX OF 10/25/2007     PLAN:  1. I personally reviewed and went over laboratory results with the patient. 2. I personally reviewed and went over radiographic studies with the patient. 3. Labs today: CBC diff, ferritin 4. [name] today  and weekly 5. Will transfuse as needed 6. Awaiting pathology results.  7. Return in 1-2 weeks for follow-up.   THERAPY PLAN:  We are awaiting pathology results from lymph node excisional biopsy by Dr. Malvin Johns.  We will communicate via telephone with the patient and family regarding results.   All questions were answered. The patient knows to call the clinic with any problems, questions or concerns. We can certainly see the patient much sooner if necessary.  Patient and plan discussed with Dr. Alla German and he is in agreement with the aforementioned.   KEFALAS,THOMAS    Addendum:  02/07/2013  I spoke with pathologist today (12/16) and she reports that he lymph node biopsy was examined and this appears to be sheets of plasma cells with elevated mitotic rate.  She reports that there are no light chains noted.  Staining and morphologically speaking, his pathology does not fit into a particular diagnosis, but the differential includes marginal/mantle cell lymphoma or lymphoplasmic cell lymphoma.  We will wait for final report.  She is going to perform additional testing.  I will test for HIV antibody and EBV panel.  Now would be a good time to refer the patient to an academic center.  I have discussed the preliminary pathology findings with the patient and his wife.  I will refer to [facility] for second opinion.  KEFALAS,THOMAS

## 2013-02-06 ENCOUNTER — Encounter (HOSPITAL_BASED_OUTPATIENT_CLINIC_OR_DEPARTMENT_OTHER): Payer: Medicare Other | Admitting: Oncology

## 2013-02-06 ENCOUNTER — Encounter (HOSPITAL_COMMUNITY): Payer: Self-pay | Admitting: Oncology

## 2013-02-06 ENCOUNTER — Encounter (HOSPITAL_BASED_OUTPATIENT_CLINIC_OR_DEPARTMENT_OTHER): Payer: Medicare Other

## 2013-02-06 ENCOUNTER — Encounter (HOSPITAL_COMMUNITY): Payer: Medicare Other

## 2013-02-06 VITALS — BP 108/68 | HR 106 | Temp 97.8°F | Resp 16 | Wt 145.0 lb

## 2013-02-06 DIAGNOSIS — D61818 Other pancytopenia: Secondary | ICD-10-CM

## 2013-02-06 DIAGNOSIS — D469 Myelodysplastic syndrome, unspecified: Secondary | ICD-10-CM

## 2013-02-06 DIAGNOSIS — R599 Enlarged lymph nodes, unspecified: Secondary | ICD-10-CM

## 2013-02-06 LAB — CBC WITH DIFFERENTIAL/PLATELET
Basophils Absolute: 0 10*3/uL (ref 0.0–0.1)
Eosinophils Relative: 0 % (ref 0–5)
Hemoglobin: 7.7 g/dL — ABNORMAL LOW (ref 13.0–17.0)
Lymphocytes Relative: 17 % (ref 12–46)
Lymphs Abs: 0.3 10*3/uL — ABNORMAL LOW (ref 0.7–4.0)
MCH: 38.1 pg — ABNORMAL HIGH (ref 26.0–34.0)
MCHC: 36 g/dL (ref 30.0–36.0)
MCV: 105.9 fL — ABNORMAL HIGH (ref 78.0–100.0)
Monocytes Relative: 45 % — ABNORMAL HIGH (ref 3–12)
Neutro Abs: 0.7 10*3/uL — ABNORMAL LOW (ref 1.7–7.7)
Platelets: 98 10*3/uL — ABNORMAL LOW (ref 150–400)
RBC: 2.02 MIL/uL — ABNORMAL LOW (ref 4.22–5.81)
Smear Review: DECREASED
WBC: 1.8 10*3/uL — ABNORMAL LOW (ref 4.0–10.5)

## 2013-02-06 LAB — FERRITIN: Ferritin: 676 ng/mL — ABNORMAL HIGH (ref 22–322)

## 2013-02-06 MED ORDER — DARBEPOETIN ALFA-POLYSORBATE 300 MCG/0.6ML IJ SOLN
300.0000 ug | Freq: Once | INTRAMUSCULAR | Status: AC
  Administered 2013-02-06: 300 ug via SUBCUTANEOUS
  Filled 2013-02-06: qty 0.6

## 2013-02-06 NOTE — Patient Instructions (Signed)
[facility] Discharge Instructions  RECOMMENDATIONS MADE BY THE CONSULTANT AND ANY TEST RESULTS WILL BE SENT TO YOUR REFERRING PHYSICIAN.  EXAM FINDINGS BY THE PHYSICIAN TODAY AND SIGNS OR SYMPTOMS TO REPORT TO CLINIC OR PRIMARY PHYSICIAN: Exam and findings as discussed by Mark Anes, PA- C.  Will get you back next week to go over results.  We will check your blood work today and give you aranesp today.  MEDICATIONS PRESCRIBED:  none  INSTRUCTIONS/FOLLOW-UP: Next week 02/15/13.  Thank you for choosing [facility] to provide your oncology and hematology care.  To afford each patient quality time with our providers, please arrive at least 15 minutes before your scheduled appointment time.  With your help, our goal is to use those 15 minutes to complete the necessary work-up to ensure our physicians have the information they need to help with your evaluation and healthcare recommendations.    Effective January 1st, 2014, we ask that you re-schedule your appointment with our physicians should you arrive 10 or more minutes late for your appointment.  We strive to give you quality time with our providers, and arriving late affects you and other patients whose appointments are after yours.    Again, thank you for choosing [facility].  Our hope is that these requests will decrease the amount of time that you wait before being seen by our physicians.       _____________________________________________________________  Should you have questions after your visit to [facility], please contact our office at [phone] between the hours of 8:30 a.m. and 5:00 p.m.  Voicemails left after 4:30 p.m. will not be returned until the following business day.  For prescription refill requests, have your pharmacy contact our office with your prescription refill request.

## 2013-02-06 NOTE — Progress Notes (Signed)
Mark Morrison presents today for injection per MD orders. [name] 300 mcg administered SQ in left Abdomen. Administration without incident. Patient tolerated well.  

## 2013-02-06 NOTE — Progress Notes (Signed)
See MD visit for notes

## 2013-02-06 NOTE — Progress Notes (Signed)
Labs drawn today for cbc/diff,ferr 

## 2013-02-06 NOTE — Addendum Note (Signed)
Addended by: Dennie Maizes on: 02/06/2013 11:59 AM   Modules accepted: Orders, SmartSet

## 2013-02-07 ENCOUNTER — Encounter (HOSPITAL_BASED_OUTPATIENT_CLINIC_OR_DEPARTMENT_OTHER): Payer: Medicare Other

## 2013-02-07 ENCOUNTER — Encounter (HOSPITAL_COMMUNITY): Payer: Medicare Other

## 2013-02-07 VITALS — BP 104/47 | HR 81 | Temp 97.2°F | Resp 16

## 2013-02-07 DIAGNOSIS — D469 Myelodysplastic syndrome, unspecified: Secondary | ICD-10-CM

## 2013-02-07 MED ORDER — ACETAMINOPHEN 325 MG PO TABS
ORAL_TABLET | ORAL | Status: AC
  Filled 2013-02-07: qty 2

## 2013-02-07 MED ORDER — SODIUM CHLORIDE 0.9 % IV SOLN
250.0000 mL | Freq: Once | INTRAVENOUS | Status: AC
  Administered 2013-02-07: 250 mL via INTRAVENOUS

## 2013-02-07 MED ORDER — SODIUM CHLORIDE 0.9 % IJ SOLN: 10.0000 mL | INTRAMUSCULAR | Status: DC | PRN

## 2013-02-07 MED ORDER — DIPHENHYDRAMINE HCL 25 MG PO CAPS
25.0000 mg | ORAL_CAPSULE | Freq: Once | ORAL | Status: AC
  Administered 2013-02-07: 25 mg via ORAL

## 2013-02-07 MED ORDER — DIPHENHYDRAMINE HCL 25 MG PO CAPS
ORAL_CAPSULE | ORAL | Status: AC
  Filled 2013-02-07: qty 1

## 2013-02-07 MED ORDER — ACETAMINOPHEN 325 MG PO TABS
650.0000 mg | ORAL_TABLET | Freq: Once | ORAL | Status: AC
  Administered 2013-02-07: 650 mg via ORAL

## 2013-02-07 NOTE — Progress Notes (Signed)
Tolerated blood transfusion well. 

## 2013-02-08 ENCOUNTER — Ambulatory Visit: Payer: Medicare Other | Admitting: Gastroenterology

## 2013-02-08 LAB — TYPE AND SCREEN
ABO/RH(D): A POS
DAT, IgG: POSITIVE
Unit division: 0

## 2013-02-13 ENCOUNTER — Encounter (HOSPITAL_BASED_OUTPATIENT_CLINIC_OR_DEPARTMENT_OTHER): Payer: Medicare Other

## 2013-02-13 DIAGNOSIS — D469 Myelodysplastic syndrome, unspecified: Secondary | ICD-10-CM

## 2013-02-13 LAB — CBC WITH DIFFERENTIAL/PLATELET
HCT: 26 % — ABNORMAL LOW (ref 39.0–52.0)
Lymphocytes Relative: 16 % (ref 12–46)
Lymphs Abs: 0.4 10*3/uL — ABNORMAL LOW (ref 0.7–4.0)
MCHC: 35.8 g/dL (ref 30.0–36.0)
Monocytes Absolute: 0.9 10*3/uL (ref 0.1–1.0)
Monocytes Relative: 37 % — ABNORMAL HIGH (ref 3–12)
Neutrophils Relative %: 47 % (ref 43–77)
Platelets: 86 10*3/uL — ABNORMAL LOW (ref 150–400)
RBC: 2.43 MIL/uL — ABNORMAL LOW (ref 4.22–5.81)
WBC: 2.3 10*3/uL — ABNORMAL LOW (ref 4.0–10.5)

## 2013-02-13 NOTE — Progress Notes (Signed)
Mark Morrison's reason for visit today are for labs as scheduled per MD orders.  Venipuncture performed with a 23 gauge butterfly needle to left hand.  Mark Morrison tolerated venipuncture well and without incident; questions were answered and patient was discharged.

## 2013-02-14 ENCOUNTER — Encounter (HOSPITAL_COMMUNITY): Payer: Medicare Other

## 2013-02-15 ENCOUNTER — Other Ambulatory Visit (HOSPITAL_COMMUNITY): Payer: Medicare Other

## 2013-02-15 ENCOUNTER — Encounter (HOSPITAL_BASED_OUTPATIENT_CLINIC_OR_DEPARTMENT_OTHER): Payer: Medicare Other

## 2013-02-15 ENCOUNTER — Encounter (HOSPITAL_COMMUNITY): Payer: Self-pay

## 2013-02-15 VITALS — Wt 142.6 lb

## 2013-02-15 DIAGNOSIS — C8300 Small cell B-cell lymphoma, unspecified site: Secondary | ICD-10-CM

## 2013-02-15 DIAGNOSIS — D469 Myelodysplastic syndrome, unspecified: Secondary | ICD-10-CM

## 2013-02-15 DIAGNOSIS — C8581 Other specified types of non-Hodgkin lymphoma, lymph nodes of head, face, and neck: Secondary | ICD-10-CM

## 2013-02-15 DIAGNOSIS — D649 Anemia, unspecified: Secondary | ICD-10-CM

## 2013-02-15 LAB — RETICULOCYTES
RBC.: 2.27 MIL/uL — ABNORMAL LOW (ref 4.22–5.81)
Retic Count, Absolute: 149.8 10*3/uL (ref 19.0–186.0)
Retic Ct Pct: 6.6 % — ABNORMAL HIGH (ref 0.4–3.1)

## 2013-02-15 LAB — HIV ANTIBODY (ROUTINE TESTING W REFLEX): HIV: NONREACTIVE

## 2013-02-15 MED ORDER — DARBEPOETIN ALFA-POLYSORBATE 300 MCG/0.6ML IJ SOLN
300.0000 ug | Freq: Once | INTRAMUSCULAR | Status: AC
  Administered 2013-02-15: 300 ug via SUBCUTANEOUS

## 2013-02-15 MED ORDER — DARBEPOETIN ALFA-POLYSORBATE 300 MCG/0.6ML IJ SOLN
INTRAMUSCULAR | Status: AC
  Filled 2013-02-15: qty 0.6

## 2013-02-15 NOTE — Progress Notes (Signed)
[facility]  OFFICE PROGRESS NOTE  Londell Moh, MD [address] [place] [address]  DIAGNOSIS: Lymphoma, lymphocytic, plasmacytoid - Plan: CBC with Differential, Lactate dehydrogenase, Reticulocytes, Multiple myeloma panel, serum, Kappa/lambda light chains, Viscosity, serum, Cryoglobulin, HIV antibody, Epstein barr virus(EBV) by PCR, Epstein-Barr Virus VCA Antibody Panel, CANCELED: Multiple myeloma panel, serum, CANCELED: Kappa/lambda light chains  MDS (myelodysplastic syndrome) - Plan: HIV antibody, Epstein barr virus(EBV) by PCR  Chief Complaint  Patient presents with  . Myelodysplastic syndrome  . Lymphoplasmacytic lymphoma    Immunoglobulin workup to be done    CURRENT THERAPY: Periodic [name] for myelodysplastic syndrome to maintain hemoglobin, recent cervical lymph node biopsy revealing findings consistent with lymphoplasmacytic lymphoma.  INTERVAL HISTORY: Mark Morrison 67 y.o. male returns for followup a receiving erythrocyte stimulating agents therapy to maintain hemoglobin for myelodysplastic syndrome with recent lymphadenopathy biopsy of which was consistent with lymphoplasmacytic lymphoma.  He remains relatively asymptomatic with good appetite without nausea, vomiting, diarrhea, or constipation. He denies severe fatigue but does experience easy satiety. He denies any fever, night sweats, further enlargement of his right posterior cervical lymph node, abdominal pain, back pain, lower extremity swelling or redness, PND, orthopnea, palpitations, chest pain, skin rash, headache, or seizures.  MEDICAL HISTORY: Past Medical History  Diagnosis Date  . Ulcerative colitis 2004    with IPPA  . 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 History of DVT (deep vein thrombosis)     and PE  . Barrett's esophagus 02/2010    on EGD  . Vitamin B 12 deficiency   . BPH (benign prostatic hyperplasia)   . GERD  (gastroesophageal reflux disease)   . Pulmonary nodules 04/01/2011  . Blood dyscrasia     dms,    per Dr. Leonia Corona  . COPD (chronic obstructive pulmonary disease)     emphasema,   xray done and 4 small areas found on test  . 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    Marland Kitchen Shortness of breath     with exertion  . Anxiety     INTERIM HISTORY: has COLONIC POLYPS; ANEMIA; THROMBOCYTOPENIA; LEUKOPENIA, MILD; TOBACCO ABUSE; ULCERATIVE COLITIS; Pouchitis; DIARRHEA; PULMONARY EMBOLISM, HX OF; BENIGN PROSTATIC HYPERTROPHY, HX OF; BARRETTS ESOPHAGUS; Pancytopenia; Pulmonary nodules; MDS (myelodysplastic syndrome); Heme positive stool; and Crohn's ileitis on his problem list.   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   ALLERGIES:  has No Known Allergies.  MEDICATIONS: has a current medication list which includes the following prescription(s): cyanocobalamin, dicyclomine, lactase, loperamide, lorazepam, mesalamine, mesalamine, multivitamin with minerals, pantoprazole, vitamin c, and zostavax.  SURGICAL HISTORY:  Past Surgical History  Procedure Laterality Date  . Knee surgery      Left  . Abdominoperineal proctocolectomy  2004    with ileal anastomosis and anal pouch creation at [facility]  . Colonoscopy  11/22/2006    ULCERATIVE COLITIS  . Transurethral resection of prostate  02/03/2011    Procedure: TRANSURETHRAL RESECTION OF THE PROSTATE (TURP);  Surgeon: Ky Barban;  Location: [facility];  Service: Urology;  Laterality: N/A;  . Ileoscopy  03/14/2010    geographic ulceratios of the ileal pouch c/w pouchitis, more proximal distal ileum mucosa normal, biospy from ulcer benign  . Esophagogastroduodenoscopy  03/14/10    barret's esophagus, small hh,  erosive duodenitis  . Colectomy    . Esophagogastroduodenoscopy   05/29/2011    SLF: Barrett's esophagus  6-7 CM SEGNMENT/ Moderate gastritis/ MILD Duodenitis/ MODERATE Hiatal hernia, Surveillance due 2016  . Flexible sigmoidoscopy N/A 11/11/2012    BJY:NWGNFA IN THE SMALL BOWEL AND POUCH ANAL CANAL STENOSED TO 10 cm  . Mass excision Right 02/02/2013    Procedure: EXCISION CERVICAL LYMPHNODES RIGHT NECK;  Surgeon: Marlane Hatcher, MD;  Location: [facility];  Service: General;  Laterality: Right;    FAMILY HISTORY: family history is negative for Anesthesia problems, Hypotension, Malignant hyperthermia, and Pseudochol deficiency.  SOCIAL HISTORY:  reports that he has been smoking Cigarettes.  He has a 25 pack-year smoking history. He quit smokeless tobacco use about 22 months ago. He reports that he does not drink alcohol or use illicit drugs.  REVIEW OF SYSTEMS:  Other than that discussed above is noncontributory.  PHYSICAL EXAMINATION: ECOG PERFORMANCE STATUS: 1 - Symptomatic but completely ambulatory  Weight 142 lb 9.6 oz (64.683 kg).  GENERAL:alert, no distress and comfortable SKIN: skin color, texture, turgor are normal, no rashes or significant lesions EYES: PERLA; Conjunctiva are pink and non-injected, sclera clear OROPHARYNX:no exudate, no erythema on lips, buccal mucosa, or tongue. NECK: supple, thyroid normal size, non-tender, without nodularity. No masses CHEST: Normal AP diameter with no breast masses. LYMPH: Right posterior lymph node palpable and noninjected measuring 2.5 cm in diameter. Nontender. LUNGS: clear to auscultation and percussion with normal breathing effort HEART: regular rate & rhythm and no murmurs. ABDOMEN:abdomen soft, non-tender and normal bowel sounds. Spleen tip is palpable. MUSCULOSKELETAL:no cyanosis of digits and no clubbing. Range of motion normal.  NEURO: alert & oriented x 3 with fluent speech, no focal motor/sensory deficits   LABORATORY DATA: Infusion on 02/13/2013  Component Date Value Range Status  . WBC  02/13/2013 2.3* 4.0 - 10.5 K/uL Final  . RBC 02/13/2013 2.43* 4.22 - 5.81 MIL/uL Final  . Hemoglobin 02/13/2013 9.3* 13.0 - 17.0 g/dL Final  . HCT 21/30/8657 26.0* 39.0 - 52.0 % Final  . MCV 02/13/2013 107.0* 78.0 - 100.0 fL Final  . MCH 02/13/2013 38.3* 26.0 - 34.0 pg Final  . MCHC 02/13/2013 35.8  30.0 - 36.0 g/dL Final  . RDW 84/69/6295 21.4* 11.5 - 15.5 % Final  . Platelets 02/13/2013 86* 150 - 400 K/uL Final   CONSISTENT WITH PREVIOUS RESULT  . Neutrophils Relative % 02/13/2013 47  43 - 77 % Final  . Neutro Abs 02/13/2013 1.1* 1.7 - 7.7 K/uL Final  . Lymphocytes Relative 02/13/2013 16  12 - 46 % Final  . Lymphs Abs 02/13/2013 0.4* 0.7 - 4.0 K/uL Final  . Monocytes Relative 02/13/2013 37* 3 - 12 % Final  . Monocytes Absolute 02/13/2013 0.9  0.1 - 1.0 K/uL Final  . Eosinophils Relative 02/13/2013 0  0 - 5 % Final  . Eosinophils Absolute 02/13/2013 0.0  0.0 - 0.7 K/uL Final  . Basophils Relative 02/13/2013 0  0 - 1 % Final  . Basophils Absolute 02/13/2013 0.0  0.0 - 0.1 K/uL Final  Office Visit on 02/06/2013  Component Date Value Range Status  . WBC 02/06/2013 1.8* 4.0 - 10.5 K/uL Final  . RBC 02/06/2013 2.02* 4.22 - 5.81 MIL/uL Final  . Hemoglobin 02/06/2013 7.7* 13.0 - 17.0 g/dL Final  . HCT 28/41/3244 21.4* 39.0 - 52.0 % Final  . MCV 02/06/2013 105.9* 78.0 - 100.0 fL Final  . MCH 02/06/2013 38.1* 26.0 - 34.0 pg Final  .  MCHC 02/06/2013 36.0  30.0 - 36.0 g/dL Final  . RDW 46/96/2952 22.3* 11.5 - 15.5 % Final  . Platelets 02/06/2013 98* 150 - 400 K/uL Final   Comment: SPECIMEN CHECKED FOR CLOTS                          PLATELET COUNT CONFIRMED BY SMEAR  . Neutrophils Relative % 02/06/2013 37* 43 - 77 % Final  . Neutro Abs 02/06/2013 0.7* 1.7 - 7.7 K/uL Final  . Lymphocytes Relative 02/06/2013 17  12 - 46 % Final  . Lymphs Abs 02/06/2013 0.3* 0.7 - 4.0 K/uL Final  . Monocytes Relative 02/06/2013 45* 3 - 12 % Final  . Monocytes Absolute 02/06/2013 0.8  0.1 - 1.0 K/uL Final  .  Eosinophils Relative 02/06/2013 0  0 - 5 % Final  . Eosinophils Absolute 02/06/2013 0.0  0.0 - 0.7 K/uL Final  . Basophils Relative 02/06/2013 1  0 - 1 % Final  . Basophils Absolute 02/06/2013 0.0  0.0 - 0.1 K/uL Final  . WBC Morphology 02/06/2013 MILD LEFT SHIFT (1-5% METAS, OCC MYELO, OCC BANDS)   Final  . RBC Morphology 02/06/2013 ROULEAUX   Final  . Smear Review 02/06/2013 PLATELETS APPEAR DECREASED   Final  . Ferritin 02/06/2013 676* 22 - 322 ng/mL Final   Performed at [facility]  . ABO/RH(D) 02/06/2013 A POS   Final  . Antibody Screen 02/06/2013 POS   Final  . Sample Expiration 02/06/2013 02/09/2013   Final  . DAT, IgG 02/06/2013 POS   Final  . Antibody ID,T [place] 02/06/2013 WARM AUTOANTIBODY   Final  . Antibody Identification 02/06/2013 WARM AUTOANTIBODY   Final  . Unit Number 02/06/2013 [id number]   Final  . Blood Component Type 02/06/2013 RBC, LR IRR   Final  . Unit division 02/06/2013 00   Final  . Status of Unit 02/06/2013 ISSUED,FINAL   Final  . Unit tag comment 02/06/2013 VERBAL ORDERS PER DR CHISM   Final  . Transfusion Status 02/06/2013 OK TO TRANSFUSE   Final  . Crossmatch Result 02/06/2013 COMPATIBLE   Final  . Unit Number 02/06/2013 [id number]   Final  . Blood Component Type 02/06/2013 RBC, LR IRR   Final  . Unit division 02/06/2013 00   Final  . Status of Unit 02/06/2013 ISSUED,FINAL   Final  . Unit tag comment 02/06/2013 VERBAL ORDERS PER DR CHISM   Final  . Transfusion Status 02/06/2013 OK TO TRANSFUSE   Final  . Crossmatch Result 02/06/2013 COMPATIBLE   Final  . Order Confirmation 02/06/2013 ORDER PROCESSED BY BLOOD BANK   Final  Admission on 02/02/2013, Discharged on 02/02/2013  Component Date Value Range Status  . WBC 02/02/2013 2.1* 4.0 - 10.5 K/uL Final  . RBC 02/02/2013 2.32* 4.22 - 5.81 MIL/uL Final  . Hemoglobin 02/02/2013 8.0* 13.0 - 17.0 g/dL Final  . HCT 95/63/8756 24.3* 39.0 - 52.0 % Final  . MCV 02/02/2013 104.7* 78.0 - 100.0 fL  Final  . MCH 02/02/2013 34.5* 26.0 - 34.0 pg Final  . MCHC 02/02/2013 32.9  30.0 - 36.0 g/dL Final  . RDW 43/32/9518 24.3* 11.5 - 15.5 % Final  . Platelets 02/02/2013 99* 150 - 400 K/uL Final  Hospital Outpatient Visit on 01/30/2013  Component Date Value Range Status  . WBC 01/30/2013 1.7* 4.0 - 10.5 K/uL Final  . RBC 01/30/2013 1.62* 4.22 - 5.81 MIL/uL Final   CORRECTED FOR COLD AGGLUTININS  .  Hemoglobin 01/30/2013 5.8* 13.0 - 17.0 g/dL Final   Comment: CRITICAL RESULT CALLED TO, READ BACK BY AND VERIFIED WITH:                          GREEN,T. AT 1644 ON 01/30/2013 BY BAUGHAM,M.  . HCT 01/30/2013 18.6* 39.0 - 52.0 % Final   CORRECTED FOR COLD AGGLUTININS  . MCV 01/30/2013 114.8* 78.0 - 100.0 fL Final   CORRECTED FOR COLD AGGLUTININS  . MCH 01/30/2013 35.8* 26.0 - 34.0 pg Final   CORRECTED FOR COLD AGGLUTININS  . MCHC 01/30/2013 31.2  30.0 - 36.0 g/dL Final   CORRECTED FOR COLD AGGLUTININS  . RDW 01/30/2013 23.8* 11.5 - 15.5 % Final  . Platelets 01/30/2013 85* 150 - 400 K/uL Final  . Sodium 01/30/2013 135  135 - 145 mEq/L Final  . Potassium 01/30/2013 3.8  3.5 - 5.1 mEq/L Final  . Chloride 01/30/2013 101  96 - 112 mEq/L Final  . CO2 01/30/2013 26  19 - 32 mEq/L Final  . Glucose, Bld 01/30/2013 122* 70 - 99 mg/dL Final  . BUN 60/45/4098 11  6 - 23 mg/dL Final  . Creatinine, Ser 01/30/2013 0.82  0.50 - 1.35 mg/dL Final  . Calcium 11/91/4782 8.2* 8.4 - 10.5 mg/dL Final  . GFR calc non Af Amer 01/30/2013 88* >90 mL/min Final  . GFR calc Af Amer 01/30/2013 >90  >90 mL/min Final   Comment: (NOTE)                          The eGFR has been calculated using the CKD EPI equation.                          This calculation has not been validated in all clinical situations.                          eGFR's persistently <90 mL/min signify possible Chronic Kidney                          Disease.  Infusion on 01/30/2013  Component Date Value Range Status  . WBC 01/30/2013 1.9* 4.0 - 10.5  K/uL Final  . RBC 01/30/2013 1.60* 4.22 - 5.81 MIL/uL Final  . Hemoglobin 01/30/2013 6.1* 13.0 - 17.0 g/dL Final   Comment: RESULT REPEATED AND VERIFIED                          CRITICAL RESULT CALLED TO, READ BACK BY AND VERIFIED WITH:                          ROBERTSON,T AT 1016 BY HUFFINES,S ON 01/30/13  . HCT 01/30/2013 18.3* 39.0 - 52.0 % Final  . MCV 01/30/2013 114.4* 78.0 - 100.0 fL Final  . MCH 01/30/2013 38.1* 26.0 - 34.0 pg Final  . MCHC 01/30/2013 33.3  30.0 - 36.0 g/dL Final  . RDW 95/62/1308 23.5* 11.5 - 15.5 % Final  . Platelets 01/30/2013 86* 150 - 400 K/uL Final   Comment: SPECIMEN CHECKED FOR CLOTS                          PLATELET COUNT CONFIRMED BY SMEAR  PLATELETS APPEAR DECREASED  . ABO/RH(D) 01/30/2013 A POS   Final  . Antibody Screen 01/30/2013 POS   Final  . Sample Expiration 01/30/2013 02/02/2013   Final  . DAT, IgG 01/30/2013 POS   Final  . Antibody Identification 01/30/2013 WARM AUTOANTIBODY   Final  . Antibody ID,T [place] 01/30/2013 WARM AUTOANTIBODY   Final  . Unit Number 01/30/2013 [id number]   Final  . Blood Component Type 01/30/2013 RBC, LR IRR   Final  . Unit division 01/30/2013 00   Final  . Status of Unit 01/30/2013 ISSUED,FINAL   Final  . Unit tag comment 01/30/2013 VERBAL ORDERS PER DR Chrissie Noa BRADFORD   Final  . Transfusion Status 01/30/2013 OK TO TRANSFUSE   Final  . Crossmatch Result 01/30/2013 LEAST INCOMPATIBLE   Final  . Unit Number 01/30/2013 [id number]   Final  . Blood Component Type 01/30/2013 RBC, LR IRR   Final  . Unit division 01/30/2013 00   Final  . Status of Unit 01/30/2013 ISSUED,FINAL   Final  . Unit tag comment 01/30/2013 VERBAL ORDERS PER DR Barbaraann Barthel   Final  . Transfusion Status 01/30/2013 OK TO TRANSFUSE   Final  . Crossmatch Result 01/30/2013 LEAST INCOMPATIBLE   Final  . Order Confirmation 01/30/2013 ORDER PROCESSED BY BLOOD BANK   Final  Infusion on 01/24/2013  Component Date Value  Range Status  . WBC 01/24/2013 2.0* 4.0 - 10.5 K/uL Final  . RBC 01/24/2013 1.58* 4.22 - 5.81 MIL/uL Final  . Hemoglobin 01/24/2013 6.6* 13.0 - 17.0 g/dL Final   Comment: RESULT REPEATED AND VERIFIED                          CRITICAL RESULT CALLED TO, READ BACK BY AND VERIFIED WITH:                          NEW T RN ON [id number] AT 1050 BY RESSEGGER R  . HCT 01/24/2013 18.2* 39.0 - 52.0 % Final  . MCV 01/24/2013 115.2* 78.0 - 100.0 fL Final  . MCH 01/24/2013 41.8* 26.0 - 34.0 pg Final  . MCHC 01/24/2013 36.3* 30.0 - 36.0 g/dL Final  . RDW 02/72/5366 23.2* 11.5 - 15.5 % Final  . Platelets 01/24/2013 80* 150 - 400 K/uL Final   PLATELET COUNT CONFIRMED BY SMEAR  . Ferritin 01/24/2013 482* 22 - 322 ng/mL Final   Performed at [facility]    PATHOLOGY:   FINAL for JAYLEN, KNOPE ([id number]) Patient: RAFAY, DAHAN Collected: 09/05/2012 Client: [facility] Accession: [id number] Received: 09/05/2012 Art Hoss DOB: 1945/07/01 Age: 75 Gender: M Reported: 09/07/2012 [address] Patient Ph: [phone] MRN #: [id number] [place], [place] [address] Visit #: [id number] Chart #: Phone: [phone] Fax: CC: Dellis Anes, PA-C BONE MARROW REPORT FINAL DIAGNOSIS Diagnosis Bone Marrow, Aspirate,Biopsy, and Clot, right iliac - HYPERCELLULAR BONE MARROW FOR AGE WITH DYSPOIETIC CHANGES. - SEE COMMENT. PERIPHERAL BLOOD: - PANCYTOPENIA. Diagnosis Note The bone marrow is hypercellular with dyspoietic changes primarily involving the granulocytic cell line. The morphologic appearance is similar to previous bone marrow ([id number]) although there is no increase in blastic cells in this material as demonstrated by morphology, flow cytometric analysis and immunohistochemical stains. Correlation with cytogenetic studies is recommended. (BNS:gt, 2012-09-07) Guerry Bruin MD Pathologist, Electronic Signature (Case signed 09/07/2012) GROSS AND MICROSCOPIC INFORMATION Specimen Clinical  Information evaluate for evolution / transformation of MDS (kp) Source Bone Marrow,  Aspirate,Biopsy, and Clot, right iliac Microscopic LAB DATA: CBC performed on 09/05/2012 shows: WBC 2.5 K/ul Neutrophils 34% HB 11.3 g/dl Lymphocytes 16% HCT 10.9 % Monocytes 32% MCV 100.9 fL Eosinophils 0% RDW 16.4 % Basophils 0% 1 of 3 FINAL for CHRISTIA, COAXUM ([id number]) Microscopic(continued) PLT 64 K/ul PERIPHERAL BLOOD SMEAR: The red blood cells display mild anisocytosis with macrocytic and normocytic cells. There is mild poikilocytosis with elliptocytes, teardrop cells. There is mild polychromasia. The white blood cells are decreased in number and show neutrophilic cells with hypolobation and/or hypogranulation. This is admixed with monocytic cells also displaying atypical features. An occasional myelocyte is seen on scan. The platelets are decreased in number. BONE MARROW ASPIRATE: Erythroid precursors: Progressive maturation with scattered maturing cells displaying nuclear cytoplasmic dyssynchrony. Granulocytic precursors: Relatively abundant. Maturing neutrophilic cells display dysgranulopoiesis with hypolobation and or hypogranulation. Obvious increase in blastic cells is not appreciated. Megakaryocytes: Abundant with scattered small hypolobated forms, forms with separate lobes. Lymphocytes/plasma cells: Significant aggregates not present. TOUCH PREPARATIONS: A mixture of cell type present. CLOT and BIOPSY: The sections show 80 to 90% cellularity with a mixture of cell types. Expansile sheets of blastic cells are not identified. Immunohistochemical stains for CD34, myeloperoxidase (MPO), CD117, and E-Cadherin were performed on block 1A and block 1B with appropriate controls. CD34 stain shows a few scattered positive cells with lack of large clusters or sheets. CD117 shows scattered weakly positive individual cells and small clusters composed of a few cells representing early erythroid and/or  granulocytic precursors. E-Cadherin shows scattering of small clusters of positive cells representing early erythroid precursors. Myeloperoxidase highlights the abundant granulocytic component present in the background. IRON STAIN: Iron stains are performed on a bone marrow aspirate smear and section of clot. The controls stained appropriately. Storage Iron: Decreased. Ringed Sideroblasts: Absent. ADDITIONAL DATA / TESTING: Specimen was sent for cytogenetic analysis and a separate report will follow. Flow cytometric analysis ([id number]) failed to show any significant blastic population. (BNS:gt, 09/06/12) Specimen Table Bone Marrow count performed on 500 cells shows: Blasts: 4% Myeloid 64% Promyelocyts: 2% Myelocytes: 17% Erythroid 16% Metamyelocyts: 3% Bands: 14% Lymphocytes: 13% Neutrophils: 23% Eosinophils: 1% Plasma Cells: 3% Basophils: 0% Monocytes: 4% M:E ratio: 3.87 Gross Received in [facility] is a 0.6 x 0.6 x 0.2 cm aggregate of red-brown soft tissue fragments, which are submitted 2 of 3 FINAL for JW, COVIN ([id number]) Gross(continued) in cassette A. Received in [facility] are two, cylindrical, tan-brown bone core biopsies measuring 1.5 and 2.0 cm in length and 0.2 cm in diameter, which are submitted in toto after decalcification in cassette B. (JK:ecj 09/05/2012) Stain(s) Used in Diagnosis The following stain(s) were used in diagnosing the case: MPO, E-CAD, CD117 (C-KIT), Iron Stain, CD 34. The control(s) stained appropriately. Report signed out from following location(s) Technical Component performed at [facility]. [address],[place] [address].CLIA:[id number],CAP:[id number]., Technical Component performed at [facility] [address], [place], [place] [address]. CLIA #: [id number], Interpretation performed at [facility] [address], [place], [place] [address]. CLIA #:  [id number],  [id number]) Patient: Quincy Simmonds R Collected: 02/02/2013 Client: [facility] Accession: [id number] Received: 02/02/2013 Barbaraann Barthel DOB: August 07, 1945 Age: 68 Gender: M Reported: 02/09/2013 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 Diagnosis  Note Sections of the lymph node reveal complete effacement by monotonous sheets of plasma cells. The majority of the cells are mature in appearance with occasional prominent nucleoli and multinucleate forms. There are frequent mitotic figures and apoptotic debris imparting a somewhat "starry sky" pattern. Occasional large immunoblast like cells are seen admixed with the plasma cells. A panel of immunohistochemical studies were ordered. CD45 is diffusely and strongly positive in all cells, even the plasma cell component. CD20 highlights scattered B-cells, with some larger immunoblast forms and many with irregular nuclear contours. CD138 demonstrates an extensive plasma cell component.CD79a is positive in the B-cells and plasma cells. The plasma cells are negative for CD56 and CD117. EMA is focally positive in the plasma cells. Kappa and lambda light chains immunohistochemistry are both negative, which correlates with lack of surface or cytoplasmic light chains by flow cytometry. CD3, CD5, and CD43 highlight T-cells only. Bcl-6 and CD10 show only residual scattered cells. CD21 and CD23 fail to demonstrate residual follicular dendritic networks. CyclinD1 is negative. CD30 is positive within many of the large immunoblast  like B-cells and occasional plasma cells. Bcl-2 demonstrates strong staining in T-cells with somewhat less intense staining in the B-cells. Ki-67 reveals a moderate proliferation rate (50%). Flow cytometry ([id number]) reveals a population of cells expressing CD19, CD38, and HLA-DR. There was no detectable expression of surface or cytoplasmic kappa or lambda light chains. The exact classification of this case is difficult given the extensive plasmacytic differentiation and many unusual features. Broadly, it appears to be a mature B-cell neoplasm with marked plasmacytic differentiation. The differential diagnosis includes a non-Hodgkin B-cell lymphoma with marked plasmacytic differentiation including marginal zone lymphoma (MZL), lymphoplasmacytic lymphoma (LPL), or less likely plasmablastic lymphoma versus a plasma cell neoplasm. The location of the tumor, isolated to lymph nodes, is unusual for for plasma cell myeloma (PCM) or plasmablastic lymphoma (PBL). There are no current mucosal lesions or history of HIV. Morphologically, there appears to be a predominant mature plasma cell component with a minor atypical B-cell component expressing bcl-2. This favors a MZL or LPL. While there is increased apoptosis and mitotic activity, the majority of plasma cells are mature making a PBL less likely. Phenotypically, the neoplasm lack light chain expression, which is unusual for a myeloma, but more often seen in MZL, LPL or PBL. Additionally, the neoplasm is unusual for PCM in the expression of CD45, CD19 and HLA-DR, with lack of CD56 or CD117. The phenotype would also be unusual for PBL due to expression of 1 of 2 FINAL for KARSIN, PESTA [phone]) Diagnosis Note(continued) strong CD45. Overall, the neoplasm is currently best classified as a mature B-cell neoplasm with marked plasma cell differentiation, and a marginal zone or lymphoplasmacytic lymphoma is favored. Although less likely, given the  possibility of a PBL and its aggressive nature, testing for Malachi Carl virus (EBER) (not available here) will be requested and reported in addendum. Additionally, a pax-5 immunohistochemistry (not available here) will be ordered and reported in an addendum. The case was discussed with Dellis Anes on 02/07/2013. Valinda Hoar MD Pathologist, Electronic Signature (Case signed 02/09/2013) Specimen Gross and Clinical Information Specimen(s) Obtained: Lymph node for lymphoma, right neck Specimen Clinical Information mass right neck Gross Received fresh is a 1.3 cm tan-pink to dark red soft node, which is sectioned, touch preparations are made on one slide, a portion is submitted in RPMI for flow cytometry, and the remaining specimen is submitted in one block for routine histology. (SSW:ecj 02/02/2013) Stain(s) used in  Diagnosis: The following stain(s) were used in diagnosing the case: CD 79a, CD 56, CD117 (C-KIT), CD 3, CD 20, BCl 2, CD 138, BCl 6 , CD 21, CD 43, KAPPA, CD 23, LAMBDA, EMA, KI-67-NO ACIS, CD-10, CD 30, CD 5, CYCLIN D1, CD45 (LCA). The control(s) stained appropriately. Disclaimer Some of these immunohistochemical stains may have been developed and the performance characteristics determined by [facility]. Some may not have been cleared or approved by the U.S. Food and Drug Administration. The FDA has determined that such clearance or approval is not necessary. This test is used for clinical purposes. It should not be regarded as investigational or for research. This laboratory is certified under the Clinical Laboratory Improvement Amendments of 1988 (CLIA-88) as qualified to perform high complexity clinical laboratory testing. Report signed out from the following location(s) Technical Component performed at [facility] [address],[place], [place] [address] CLIA: [id number]., Technical Component performed at [facility] [address], [place], [place] [address]. CLIA #: [id number], Interpretation performed at [facility] [address], [place], [place] [address]. CLIA #: [id number],  -------------------------------------------------------------------------------------------------------------------------------  Urinalysis    Component Value Date/Time   COLORURINE RED* 09/17/2012 1700   APPEARANCEUR HAZY* 09/17/2012 1700   LABSPEC 1.015 09/17/2012 1700   PHURINE 7.0 09/17/2012 1700   GLUCOSEU 100* 09/17/2012 1700   HGBUR LARGE* 09/17/2012 1700   BILIRUBINUR NEGATIVE 09/17/2012 1700   KETONESUR 15* 09/17/2012 1700   PROTEINUR >300* 09/17/2012 1700   UROBILINOGEN 4.0* 09/17/2012 1700   NITRITE POSITIVE* 09/17/2012 1700   LEUKOCYTESUR LARGE* 09/17/2012 1700    RADIOGRAPHIC STUDIES: Ct Soft Tissue Neck W Contrast  01/23/2013   CLINICAL DATA:  Lymphadenopathy.  Myelodysplastic syndrome.  EXAM: CT NECK WITH CONTRAST  TECHNIQUE: Multidetector CT imaging of the neck was performed using the standard protocol following the bolus administration of intravenous contrast.  CONTRAST:  OMNIPAQUE IOHEXOL 300 MG/ML  SOLN  COMPARISON:  None.  FINDINGS: No focal mucosal or submucosal lesions are present. The vocal cords are midline and symmetric. The tongue musculature is unremarkable.  An enlarged posterior right level 2 lymph node measures 16 x 12 x 19 mm. Multiple right posterior triangle lymph nodes are evident. The largest measures 14.5 x 13 x 13 mm. Smaller supraclavicular lymph nodes are present on the right.  No significant left-sided adenopathy is present.  The soft tissues of the neck are otherwise unremarkable. The thyroid is within normal limits.  The lung apices are clear.  The bone windows demonstrate mild degenerative changes, most evident at C3-4 with bilateral uncovertebral and facet spurring.  IMPRESSION: Limited  1. Enlarged right level 2 and posterior triangle lymph nodes as described. These nodes are rounded  and appear pathologic. 2. No significant left-sided adenopathy. 3. Mild spondylosis of the cervical spine is most evident at C3-4.   Electronically Signed   By: Gennette Pac M.D.   On: 01/23/2013 10:20   Ct Chest W Contrast  01/23/2013   CLINICAL DATA:  Followup adenopathy.  EXAM: CT CHEST, ABDOMEN, AND PELVIS WITH CONTRAST  TECHNIQUE: Multidetector CT imaging of the chest, abdomen and pelvis was performed following the standard protocol during bolus administration of intravenous contrast.  CONTRAST:  OMNIPAQUE IOHEXOL 300 MG/ML  SOLN  COMPARISON:  CT chest 09/23/2012, CT abdomen and pelvis 09/23/2012  FINDINGS: CT CHEST FINDINGS  No pleural effusion identified. There is no airspace consolidation or atelectasis noted. Tiny nodule within the left upper lobe is unchanged measuring  2-3 mm, image 41/series 3. There has been resolution of previously noted nodules in the posterior right lower lobe, image 42/series 3. No new or enlarging nodules or mass is identified. The trachea appears patent and is midline. The heart size appears normal. There is a right paratracheal lymph node measuring 1.1 cm, image 24/ series 2. Previously this measured 0.5 cm. No enlarged hilar lymph nodes. Calcified atherosclerotic change involves the thoracic aorta of. Calcifications involving the LAD Coronary artery also noted. No enlarged axillary or supraclavicular lymph nodes.  CT ABDOMEN AND PELVIS FINDINGS  No focal liver abnormality identified. Gallstones are identified measuring up to 6 mm, image 71/series 2. No biliary dilatation. The pancreas is unremarkable. The spleen measures 15 cm in length. This is compared with 14 cm on 09/23/2012. Stable low attenuation structure within the spleen measuring 1.3 cm, image 74/ series 2. There is a right renal cyst which measures 2.5 cm, image 62/series 2. There is marked enlargement of the prostate gland which has a transverse diameter of 8 cm, image 112/ series 2. Calcifications within  the urinary bladder are identified.  Upper abdominal adenopathy is again identified. Periaortic lymph node measures 2.3 cm, image number 74/ series 2. This is compared with 2.0 cm previously. Just above the bifurcation of the abdominal aorta there is a 1.7 cm periaortic lymph node, image 84/ series 2. Previously 1.9 cm. Multiple enlarged left iliac lymph nodes are identified. Index left external iliac node measures 3 cm, image 108/series 2. Previously 2.3 cm.  There is no free fluid or abnormal fluid collections identified within the abdomen or pelvis. The stomach is normal. The small bowel loops have a normal course and caliber. Postoperative change from prior colectomy appears stable.  Review of the visualized osseous structures is significant for lumbar degenerative disc disease. No aggressive lytic or sclerotic bone lesions identified.  IMPRESSION: Chest CT:  1. Borderline enlarged right paratracheal lymph node is increased in size from 09/28/2011. 2. Stable 2-3 mm nodule in the lingular portion of the left lung. 3. Resolution of previous right lower lobe nodules 4. Atherosclerotic disease including coronary artery calcifications  Abdomen CT:  1. Persistent abdominal and pelvic adenopathy. This is stable to slightly increased in the interval. 2. Splenomegaly. 3. Prostate gland enlargement. 4. Gallstones.   Electronically Signed   By: Signa Kell M.D.   On: 01/23/2013 09:44   Ct Abdomen Pelvis W Contrast  01/23/2013   CLINICAL DATA:  Followup adenopathy.  EXAM: CT CHEST, ABDOMEN, AND PELVIS WITH CONTRAST  TECHNIQUE: Multidetector CT imaging of the chest, abdomen and pelvis was performed following the standard protocol during bolus administration of intravenous contrast.  CONTRAST:  OMNIPAQUE IOHEXOL 300 MG/ML  SOLN  COMPARISON:  CT chest 09/23/2012, CT abdomen and pelvis 09/23/2012  FINDINGS: CT CHEST FINDINGS  No pleural effusion identified. There is no airspace consolidation or atelectasis noted.  Tiny nodule within the left upper lobe is unchanged measuring 2-3 mm, image 41/series 3. There has been resolution of previously noted nodules in the posterior right lower lobe, image 42/series 3. No new or enlarging nodules or mass is identified. The trachea appears patent and is midline. The heart size appears normal. There is a right paratracheal lymph node measuring 1.1 cm, image 24/ series 2. Previously this measured 0.5 cm. No enlarged hilar lymph nodes. Calcified atherosclerotic change involves the thoracic aorta of. Calcifications involving the LAD Coronary artery also noted. No enlarged axillary or supraclavicular lymph nodes.  CT ABDOMEN AND  PELVIS FINDINGS  No focal liver abnormality identified. Gallstones are identified measuring up to 6 mm, image 71/series 2. No biliary dilatation. The pancreas is unremarkable. The spleen measures 15 cm in length. This is compared with 14 cm on 09/23/2012. Stable low attenuation structure within the spleen measuring 1.3 cm, image 74/ series 2. There is a right renal cyst which measures 2.5 cm, image 62/series 2. There is marked enlargement of the prostate gland which has a transverse diameter of 8 cm, image 112/ series 2. Calcifications within the urinary bladder are identified.  Upper abdominal adenopathy is again identified. Periaortic lymph node measures 2.3 cm, image number 74/ series 2. This is compared with 2.0 cm previously. Just above the bifurcation of the abdominal aorta there is a 1.7 cm periaortic lymph node, image 84/ series 2. Previously 1.9 cm. Multiple enlarged left iliac lymph nodes are identified. Index left external iliac node measures 3 cm, image 108/series 2. Previously 2.3 cm.  There is no free fluid or abnormal fluid collections identified within the abdomen or pelvis. The stomach is normal. The small bowel loops have a normal course and caliber. Postoperative change from prior colectomy appears stable.  Review of the visualized osseous structures  is significant for lumbar degenerative disc disease. No aggressive lytic or sclerotic bone lesions identified.  IMPRESSION: Chest CT:  1. Borderline enlarged right paratracheal lymph node is increased in size from 09/28/2011. 2. Stable 2-3 mm nodule in the lingular portion of the left lung. 3. Resolution of previous right lower lobe nodules 4. Atherosclerotic disease including coronary artery calcifications  Abdomen CT:  1. Persistent abdominal and pelvic adenopathy. This is stable to slightly increased in the interval. 2. Splenomegaly. 3. Prostate gland enlargement. 4. Gallstones.   Electronically Signed   By: Signa Kell M.D.   On: 01/23/2013 09:44    ASSESSMENT:  #1. Myelodysplastic syndrome with anemia, for subcutaneous [name] today. #2. 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 He does have documented abdominal lymphadenopathy with splenomegaly on CT scan. #3. History of Crohn's disease, quiescent. #4. Barrett's esophagus. #5. Gallstones.   PLAN:  #1. Discussion was held with the patient and his wife regarding the lymph node biopsy findings. Tissue has been sent at [facility] and the patient does have and it appointment for second opinion and evaluation on 03/03/2013. At this point there do not appear to be  symptoms to warrant any intervention for his lymphoma, treatment of which would be complicated by the fact that he has a bone marrow disorder already which would make it difficult to administer chemotherapy. #2. [name] subcutaneously was given today. #3. Followup on March 18, 2013 to discuss results of today's lab tests in conjunction with previous lymph node biopsy.   All questions were answered. The patient knows to call the clinic with any problems, questions or  concerns. We can certainly see the patient much sooner if necessary.   I spent 25 minutes counseling the patient face to face. The total time spent in the appointment was 30 minutes.    Maurilio Lovely, MD 02/15/2013 11:05 AM

## 2013-02-15 NOTE — Progress Notes (Signed)
Labs drawn today for retic,,cryoglobulin,viscosity,ldh,hiv,mm,kllc,ebv

## 2013-02-15 NOTE — Patient Instructions (Signed)
[name] Discharge Instructions  RECOMMENDATIONS MADE BY THE CONSULTANT AND ANY TEST RESULTS WILL BE SENT TO YOUR REFERRING PHYSICIAN.  EXAM FINDINGS BY THE PHYSICIAN TODAY AND SIGNS OR SYMPTOMS TO REPORT TO CLINIC OR PRIMARY PHYSICIAN: Exam and findings as discussed by Dr. Zigmund Daniel.  Will give you [name] today.   MEDICATIONS PRESCRIBED:  none  INSTRUCTIONS/FOLLOW-UP: Return as scheduled for [name] and MD visit.  Thank you for choosing [facility] to provide your oncology and hematology care.  To afford each patient quality time with our providers, please arrive at least 15 minutes before your scheduled appointment time.  With your help, our goal is to use those 15 minutes to complete the necessary work-up to ensure our physicians have the information they need to help with your evaluation and healthcare recommendations.    Effective January 1st, 2014, we ask that you re-schedule your appointment with our physicians should you arrive 10 or more minutes late for your appointment.  We strive to give you quality time with our providers, and arriving late affects you and other patients whose appointments are after yours.    Again, thank you for choosing [facility].  Our hope is that these requests will decrease the amount of time that you wait before being seen by our physicians.       _____________________________________________________________  Should you have questions after your visit to [facility], please contact our office at [phone] between the hours of 8:30 a.m. and 5:00 p.m.  Voicemails left after 4:30 p.m. will not be returned until the following business day.  For prescription refill requests, have your pharmacy contact our office with your prescription refill request.

## 2013-02-15 NOTE — Progress Notes (Signed)
[facility] presents today for injection per MD orders. [name] 300 administered SQ in left Abdomen. Administration without incident. Patient tolerated well.

## 2013-02-16 LAB — EPSTEIN-BARR VIRUS VCA ANTIBODY PANEL
EBV EA IgG: 140 U/mL — ABNORMAL HIGH (ref <9.0)
EBV NA IgG: 11.4 U/mL (ref <18.0)
EBV VCA IgG: >750 U/mL — ABNORMAL HIGH (ref <18.0)
EBV VCA IgM: <10 U/mL (ref <36.0)

## 2013-02-17 LAB — KAPPA/LAMBDA LIGHT CHAINS: Kappa, lambda light chain ratio: 1.19 (ref 0.26–1.65)

## 2013-02-18 LAB — VISCOSITY, SERUM: Viscosity, Serum: 1.9 rel to H2O (ref 1.5–1.9)

## 2013-02-20 LAB — MULTIPLE MYELOMA PANEL, SERUM
Albumin ELP: 37.6 % — ABNORMAL LOW (ref 55.8–66.1)
Alpha-1-Globulin: 6 % — ABNORMAL HIGH (ref 2.9–4.9)
Beta 2: 10.5 % — ABNORMAL HIGH (ref 3.2–6.5)
Gamma Globulin: 33.7 % — ABNORMAL HIGH (ref 11.1–18.8)
IgG (Immunoglobin G), Serum: 2880 mg/dL — ABNORMAL HIGH (ref 650–1600)
IgM, Serum: 124 mg/dL (ref 41–251)
M-Spike, %: NOT DETECTED g/dL
Total Protein: 7.2 g/dL (ref 6.0–8.3)

## 2013-02-20 LAB — CRYOGLOBULIN

## 2013-02-21 ENCOUNTER — Encounter: Payer: Self-pay | Admitting: Oncology

## 2013-02-22 ENCOUNTER — Encounter (HOSPITAL_COMMUNITY): Payer: Self-pay | Admitting: Emergency Medicine

## 2013-02-22 ENCOUNTER — Inpatient Hospital Stay (HOSPITAL_COMMUNITY)
Admission: EM | Admit: 2013-02-22 | Discharge: 2013-02-22 | DRG: 813 | Disposition: A | Discharge status: Other Acute Inpatient Hospital | Payer: Medicare Other | Attending: Family Medicine | Admitting: Family Medicine

## 2013-02-22 DIAGNOSIS — D469 Myelodysplastic syndrome, unspecified: Secondary | ICD-10-CM | POA: Diagnosis present

## 2013-02-22 DIAGNOSIS — R310 Gross hematuria: Secondary | ICD-10-CM | POA: Diagnosis present

## 2013-02-22 DIAGNOSIS — R233 Spontaneous ecchymoses: Secondary | ICD-10-CM | POA: Diagnosis present

## 2013-02-22 DIAGNOSIS — C8589 Other specified types of non-Hodgkin lymphoma, extranodal and solid organ sites: Secondary | ICD-10-CM | POA: Diagnosis present

## 2013-02-22 DIAGNOSIS — Z933 Colostomy status: Secondary | ICD-10-CM

## 2013-02-22 DIAGNOSIS — K625 Hemorrhage of anus and rectum: Secondary | ICD-10-CM | POA: Diagnosis present

## 2013-02-22 DIAGNOSIS — J4489 Other specified chronic obstructive pulmonary disease: Secondary | ICD-10-CM | POA: Diagnosis present

## 2013-02-22 DIAGNOSIS — D539 Nutritional anemia, unspecified: Secondary | ICD-10-CM | POA: Diagnosis present

## 2013-02-22 DIAGNOSIS — F411 Generalized anxiety disorder: Secondary | ICD-10-CM | POA: Diagnosis present

## 2013-02-22 DIAGNOSIS — R319 Hematuria, unspecified: Secondary | ICD-10-CM

## 2013-02-22 DIAGNOSIS — D696 Thrombocytopenia, unspecified: Principal | ICD-10-CM

## 2013-02-22 DIAGNOSIS — K219 Gastro-esophageal reflux disease without esophagitis: Secondary | ICD-10-CM | POA: Diagnosis present

## 2013-02-22 DIAGNOSIS — J449 Chronic obstructive pulmonary disease, unspecified: Secondary | ICD-10-CM | POA: Diagnosis present

## 2013-02-22 DIAGNOSIS — Z86711 Personal history of pulmonary embolism: Secondary | ICD-10-CM

## 2013-02-22 DIAGNOSIS — E538 Deficiency of other specified B group vitamins: Secondary | ICD-10-CM | POA: Diagnosis present

## 2013-02-22 DIAGNOSIS — F17200 Nicotine dependence, unspecified, uncomplicated: Secondary | ICD-10-CM | POA: Diagnosis present

## 2013-02-22 DIAGNOSIS — N400 Enlarged prostate without lower urinary tract symptoms: Secondary | ICD-10-CM | POA: Diagnosis present

## 2013-02-22 DIAGNOSIS — Z8249 Family history of ischemic heart disease and other diseases of the circulatory system: Secondary | ICD-10-CM

## 2013-02-22 DIAGNOSIS — D649 Anemia, unspecified: Secondary | ICD-10-CM

## 2013-02-22 DIAGNOSIS — Z86718 Personal history of other venous thrombosis and embolism: Secondary | ICD-10-CM

## 2013-02-22 LAB — CBC WITH DIFFERENTIAL/PLATELET
Basophils Absolute: 0 10*3/uL (ref 0.0–0.1)
Basophils Absolute: 0 10*3/uL (ref 0.0–0.1)
Basophils Relative: 0 % (ref 0–1)
Eosinophils Absolute: 0 10*3/uL (ref 0.0–0.7)
Eosinophils Absolute: 0 10*3/uL (ref 0.0–0.7)
Eosinophils Absolute: 0 10*3/uL (ref 0.0–0.7)
Eosinophils Relative: 1 % (ref 0–5)
HCT: 20.4 % — ABNORMAL LOW (ref 39.0–52.0)
HCT: 23.9 % — ABNORMAL LOW (ref 39.0–52.0)
Lymphocytes Relative: 21 % (ref 12–46)
Lymphs Abs: 0.3 10*3/uL — ABNORMAL LOW (ref 0.7–4.0)
Lymphs Abs: 0.4 10*3/uL — ABNORMAL LOW (ref 0.7–4.0)
MCH: 35.8 pg — ABNORMAL HIGH (ref 26.0–34.0)
MCH: 35.4 pg — ABNORMAL HIGH (ref 26.0–34.0)
MCHC: 33 g/dL (ref 30.0–36.0)
MCHC: 32.8 g/dL (ref 30.0–36.0)
MCHC: 35.1 g/dL (ref 30.0–36.0)
MCV: 107.9 fL — ABNORMAL HIGH (ref 78.0–100.0)
MCV: 104.8 fL — ABNORMAL HIGH (ref 78.0–100.0)
Monocytes Absolute: 0.8 10*3/uL (ref 0.1–1.0)
Monocytes Absolute: 0.4 10*3/uL (ref 0.1–1.0)
Monocytes Absolute: 0.5 10*3/uL (ref 0.1–1.0)
Monocytes Relative: 31 % — ABNORMAL HIGH (ref 3–12)
Neutro Abs: 0.8 10*3/uL — ABNORMAL LOW (ref 1.7–7.7)
Neutro Abs: 3.4 10*3/uL (ref 1.7–7.7)
Neutrophils Relative %: 51 % (ref 43–77)
Neutrophils Relative %: 52 % (ref 43–77)
Platelets: <5 10*3/uL — CL (ref 150–400)
Platelets: <5 10*3/uL — CL (ref 150–400)
Platelets: <5 10*3/uL — CL (ref 150–400)
RBC: 2.28 MIL/uL — ABNORMAL LOW (ref 4.22–5.81)
RDW: 22.5 % — ABNORMAL HIGH (ref 11.5–15.5)
RDW: 22.6 % — ABNORMAL HIGH (ref 11.5–15.5)
RDW: 22.1 % — ABNORMAL HIGH (ref 11.5–15.5)
WBC: 2.6 10*3/uL — ABNORMAL LOW (ref 4.0–10.5)

## 2013-02-22 LAB — URINALYSIS, ROUTINE W REFLEX MICROSCOPIC
Bilirubin Urine: NEGATIVE
Glucose, UA: 100 mg/dL — AB
Ketones, ur: 15 mg/dL — AB
Nitrite: POSITIVE — AB
Protein, ur: >300 mg/dL — AB
Urobilinogen, UA: >8 mg/dL — ABNORMAL HIGH (ref 0.0–1.0)
pH: 7 (ref 5.0–8.0)

## 2013-02-22 LAB — COMPREHENSIVE METABOLIC PANEL
ALT: 12 U/L (ref 0–53)
Alkaline Phosphatase: 189 U/L — ABNORMAL HIGH (ref 39–117)
Creatinine, Ser: 0.79 mg/dL (ref 0.50–1.35)
GFR calc Af Amer: >90 mL/min (ref >90)
Glucose, Bld: 105 mg/dL — ABNORMAL HIGH (ref 70–99)
Potassium: 3.7 mEq/L (ref 3.7–5.3)
Sodium: 133 mEq/L — ABNORMAL LOW (ref 137–147)
Total Bilirubin: 2.8 mg/dL — ABNORMAL HIGH (ref 0.3–1.2)
Total Protein: 7.4 g/dL (ref 6.0–8.3)

## 2013-02-22 LAB — URINE MICROSCOPIC-ADD ON

## 2013-02-22 LAB — PROTIME-INR: INR: 1.49 (ref 0.00–1.49)

## 2013-02-22 LAB — IRON AND TIBC
Iron: 53 ug/dL (ref 42–135)
Saturation Ratios: 38 % (ref 20–55)
TIBC: 138 ug/dL — ABNORMAL LOW (ref 215–435)

## 2013-02-22 LAB — LACTATE DEHYDROGENASE: LDH: 148 U/L (ref 94–250)

## 2013-02-22 LAB — FOLATE: Folate: 17.9 ng/mL

## 2013-02-22 LAB — RETICULOCYTES: Retic Count, Absolute: 158.8 10*3/uL (ref 19.0–186.0)

## 2013-02-22 LAB — VITAMIN B12: Vitamin B-12: 1028 pg/mL — ABNORMAL HIGH (ref 211–911)

## 2013-02-22 LAB — FERRITIN: Ferritin: 939 ng/mL — ABNORMAL HIGH (ref 22–322)

## 2013-02-22 MED ORDER — ONDANSETRON HCL 4 MG PO TABS: 4.0000 mg | ORAL_TABLET | Freq: Four times a day (QID) | ORAL | Status: DC | PRN

## 2013-02-22 MED ORDER — MESALAMINE 400 MG PO CPDR
1600.0000 mg | DELAYED_RELEASE_CAPSULE | Freq: Three times a day (TID) | ORAL | Status: DC
  Administered 2013-02-22 (×2): 1600 mg via ORAL
  Filled 2013-02-22 (×7): qty 4

## 2013-02-22 MED ORDER — ACETAMINOPHEN 650 MG RE SUPP: 650.0000 mg | Freq: Four times a day (QID) | RECTAL | Status: DC | PRN

## 2013-02-22 MED ORDER — SODIUM CHLORIDE 0.9 % IV SOLN
INTRAVENOUS | Status: DC
  Administered 2013-02-22: 1 mL via INTRAVENOUS

## 2013-02-22 MED ORDER — HYDROCORTISONE SOD SUCCINATE 100 MG IJ SOLR: 100.0000 mg | Freq: Four times a day (QID) | INTRAMUSCULAR | Status: AC

## 2013-02-22 MED ORDER — DICYCLOMINE HCL 10 MG PO CAPS
10.0000 mg | ORAL_CAPSULE | Freq: Three times a day (TID) | ORAL | Status: DC
  Administered 2013-02-22 (×3): 10 mg via ORAL
  Filled 2013-02-22 (×3): qty 1

## 2013-02-22 MED ORDER — ACETAMINOPHEN 325 MG PO TABS: 650.0000 mg | ORAL_TABLET | Freq: Four times a day (QID) | ORAL | Status: DC | PRN

## 2013-02-22 MED ORDER — DOPAMINE-DEXTROSE 3.2-5 MG/ML-% IV SOLN
INTRAVENOUS | Status: AC
  Filled 2013-02-22: qty 250

## 2013-02-22 MED ORDER — ONDANSETRON HCL 4 MG PO TABS: 4.0000 mg | ORAL_TABLET | Freq: Four times a day (QID) | ORAL | Status: AC | PRN

## 2013-02-22 MED ORDER — ONDANSETRON HCL 4 MG/2ML IJ SOLN: 4.0000 mg | Freq: Four times a day (QID) | INTRAMUSCULAR | Status: DC | PRN

## 2013-02-22 MED ORDER — MORPHINE SULFATE 2 MG/ML IJ SOLN
2.0000 mg | INTRAMUSCULAR | Status: AC
  Filled 2013-02-22: qty 1

## 2013-02-22 MED ORDER — MORPHINE SULFATE 2 MG/ML IJ SOLN
2.0000 mg | INTRAMUSCULAR | Status: AC
  Administered 2013-02-22: 2 mg via INTRAVENOUS
  Filled 2013-02-22: qty 1

## 2013-02-22 MED ORDER — FUROSEMIDE 10 MG/ML IJ SOLN: 20.0000 mg | Freq: Once | INTRAMUSCULAR | Status: DC

## 2013-02-22 MED ORDER — HYDROCORTISONE SOD SUCCINATE 100 MG IJ SOLR
100.0000 mg | Freq: Four times a day (QID) | INTRAMUSCULAR | Status: DC
  Administered 2013-02-22: 100 mg via INTRAVENOUS
  Filled 2013-02-22: qty 2

## 2013-02-22 MED ORDER — MORPHINE SULFATE 2 MG/ML IJ SOLN: 2.0000 mg | INTRAMUSCULAR | Status: AC

## 2013-02-22 MED ORDER — SODIUM CHLORIDE 0.9 % IJ SOLN
3.0000 mL | Freq: Two times a day (BID) | INTRAMUSCULAR | Status: DC
  Administered 2013-02-22: 3 mL via INTRAVENOUS

## 2013-02-22 MED ORDER — FUROSEMIDE 10 MG/ML IJ SOLN: 20.0000 mg | Freq: Once | INTRAMUSCULAR | Status: AC

## 2013-02-22 MED ORDER — ALBUTEROL SULFATE (2.5 MG/3ML) 0.083% IN NEBU: 2.5000 mg | INHALATION_SOLUTION | RESPIRATORY_TRACT | Status: AC | PRN

## 2013-02-22 MED ORDER — LORAZEPAM 0.5 MG PO TABS: 0.5000 mg | ORAL_TABLET | Freq: Two times a day (BID) | ORAL | Status: AC | PRN

## 2013-02-22 MED ORDER — ALBUTEROL SULFATE (2.5 MG/3ML) 0.083% IN NEBU: 2.5000 mg | INHALATION_SOLUTION | RESPIRATORY_TRACT | Status: DC | PRN

## 2013-02-22 MED ORDER — HYDROCORTISONE SOD SUCCINATE 100 MG IJ SOLR
100.0000 mg | Freq: Four times a day (QID) | INTRAMUSCULAR | Status: DC
  Administered 2013-02-22 (×2): 100 mg via INTRAVENOUS
  Filled 2013-02-22 (×2): qty 2

## 2013-02-22 MED ORDER — LORAZEPAM 0.5 MG PO TABS: 0.5000 mg | ORAL_TABLET | Freq: Two times a day (BID) | ORAL | Status: DC | PRN

## 2013-02-22 MED ORDER — PANTOPRAZOLE SODIUM 40 MG PO TBEC
40.0000 mg | DELAYED_RELEASE_TABLET | Freq: Every day | ORAL | Status: DC
  Administered 2013-02-22: 40 mg via ORAL
  Filled 2013-02-22: qty 1

## 2013-02-22 MED ORDER — MESALAMINE 800 MG PO TBEC
2.0000 | DELAYED_RELEASE_TABLET | Freq: Three times a day (TID) | ORAL | Status: DC
  Filled 2013-02-22: qty 2

## 2013-02-22 MED ORDER — SODIUM CHLORIDE 0.9 % IV SOLN
INTRAVENOUS | Status: DC
  Administered 2013-02-22: 19:00:00 via INTRAVENOUS

## 2013-02-22 NOTE — Consult Note (Signed)
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   HISTORY OF PRESENT ILLNESS:   Mark Morrison is a 67 year old Caucasian man who is well-known to the [facility] where is followed for his MDS which has not required chemotherapeutic intervention to-date.  Recently, [name] underwent a right cervical neck lymph node excisional biopsy by Dr. Malvin Johns on 02/03/2013 for a diagnosis of his recent clinical change with findings of lymph nodes in neck and right supraclavicular fossa. 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    [name] reported to the ED on 02/21/2013 with bleeding.  He reports that he started to bleed from a spot on his skin in the suprapubic region.  He denies any known trauma or previous skin breakage of this area.  He explains that he "thought the dog scratched him" and that was causing the bleeding.  However, it continued to bleed and he could not identify a scratch from the dog.  He then noted a small amount of blood in his urine when he urinated at home.  Since the suprapubic bleeding from the skin continued, he decided to report to the ED.  In the ED, [name] notes that he urinated a significant amount of blood at that time.   He denies any new medications or OTC medications in the recent past, including supplements.  In the ED, labs were drawn and he was noted to be severely thrombocytopenic with a platelet count less than 5,000.  He denies any hematemesis, hemoptysis and any other bleeding.  He was subsequently admitted.  He is finishing his platelet transfusion as we speak in conversation this AM.  He has 1  unit of PRBCs ordered for his Hgb of 7.7 g/dL.  His WBC count is stable with an adequate neutrophil count.  It is noted that the patient experienced an acute drop in platelets from 02/13/2013 when his platelet count was 86,000. Additionally, on his type and screen, he is noted to have a warm autoantibody with a + DAT, lending to a possible immune mediated issue.  Other than bleeding, [name] reports that he feels well.  He denies any abdominal pain or headaches.  He denies any complaints.  Due to this patient's complicated situation, we have referred him to [facility] for a second opinion and that appointment is for 03/03/2013.  He has two malignancies and modality of treatment, and consideration for bone marrow transplant is reasonable.    PAST MEDICAL HISTORY:   Past Medical History  Diagnosis Date  . Ulcerative colitis 2004    with IPPA  . 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 History of DVT (deep vein thrombosis)     and PE  . Barrett's esophagus 02/2010    on EGD  . Vitamin B 12 deficiency   . BPH (benign prostatic hyperplasia)   . GERD (gastroesophageal reflux disease)   . Pulmonary nodules 04/01/2011  . Blood dyscrasia     dms,    per Dr. Leonia Corona  . COPD (chronic obstructive pulmonary disease)     emphasema,   xray done and 4 small areas found on test  .  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    Marland Kitchen Shortness of breath     with exertion  . Anxiety     ALLERGIES: No Known Allergies    MEDICATIONS: I have reviewed the patient's current medications.     PAST SURGICAL HISTORY Past Surgical History  Procedure Laterality Date  . Knee surgery      Left  . Abdominoperineal proctocolectomy  2004    with ileal anastomosis and anal pouch creation at [facility]  . Colonoscopy  11/22/2006    ULCERATIVE COLITIS  . Transurethral resection of prostate  02/03/2011    Procedure: TRANSURETHRAL RESECTION  OF THE PROSTATE (TURP);  Surgeon: Ky Barban;  Location: [facility];  Service: Urology;  Laterality: N/A;  . Ileoscopy  03/14/2010    geographic ulceratios of the ileal pouch c/w pouchitis, more proximal distal ileum mucosa normal, biospy from ulcer benign  . Esophagogastroduodenoscopy  03/14/10    barret's esophagus, small hh, erosive duodenitis  . Colectomy    . Esophagogastroduodenoscopy  05/29/2011    SLF: Barrett's esophagus  6-7 CM SEGNMENT/ Moderate gastritis/ MILD Duodenitis/ MODERATE Hiatal hernia, Surveillance due 2016  . Flexible sigmoidoscopy N/A 11/11/2012    WUJ:[id number] IN THE SMALL BOWEL AND POUCH ANAL CANAL STENOSED TO 10 cm  . Mass excision Right 02/02/2013    Procedure: EXCISION CERVICAL LYMPHNODES RIGHT NECK;  Surgeon: Marlane Hatcher, MD;  Location: [facility];  Service: General;  Laterality: Right;    FAMILY HISTORY: Family History  Problem Relation Age of Onset  . Anesthesia problems Neg Hx   . Hypotension Neg Hx   . Malignant hyperthermia Neg Hx   . Pseudochol deficiency Neg Hx     SOCIAL HISTORY:  reports that he has been smoking Cigarettes.  He has a 25 pack-year smoking history. He quit smokeless tobacco use about 22 months ago. He reports that he does not drink alcohol or use illicit drugs.  PERFORMANCE STATUS: The patient's performance status is 1 - Symptomatic but completely ambulatory  PHYSICAL EXAM: Most Recent Vital Signs: Blood pressure 98/70, pulse 89, temperature 98 F (36.7 C), temperature source Oral, resp. rate 18, height 5\' 7"  (1.702 m), weight 143 lb (64.864 kg), SpO2 99.00%. General appearance: alert, cooperative, appears stated age, no distress and blunted affect Head: Normocephalic, without obvious abnormality, atraumatic Eyes: negative findings: lids and lashes normal, conjunctivae and sclerae normal, corneas clear and pupils equal, round, reactive to light and accomodation Throat: abnormal findings: petechiae of palate Neck: supple,  symmetrical, trachea midline and right sided lymphadenopathy on right anterior cervical chain and right supraclavicular fossa as previously dictated Back: petechial rash Lungs: clear to auscultation bilaterally Chest wall: petechial rash Heart: regular rate and rhythm, S1, S2 normal, no murmur, click, rub or gallop Abdomen: abnormal findings:  suprapubic bleeding wound with bandage in place Extremities: diffuse petechial rash Skin: petechiae - torso, back, upper leg(s) bilateral, lower leg(s) bilateral, feet bilateral Neurologic: Grossly normal  LABORATORY DATA:  Results for orders placed during the hospital encounter of 02/22/13 (from the past 48 hour(s))  CBC WITH DIFFERENTIAL     Status: Abnormal   Collection Time    02/22/13  1:56 AM      Result Value Range   WBC 2.6 (*) 4.0 - 10.5 K/uL   RBC 2.15 (*) 4.22 - 5.81 MIL/uL   Hemoglobin 7.7 (*) 13.0 - 17.0 g/dL   HCT 78.2 (*) 95.6 -  52.0 %   MCV 108.4 (*) 78.0 - 100.0 fL   MCH 35.8 (*) 26.0 - 34.0 pg   MCHC 33.0  30.0 - 36.0 g/dL   RDW 16.1 (*) 09.6 - 04.5 %   Platelets <5 (*) 150 - 400 K/uL   Comment: RESULT REPEATED AND VERIFIED     PLATELET COUNT CONFIRMED BY SMEAR     CRITICAL RESULT CALLED TO, READ BACK BY AND VERIFIED WITH:     KOEHART K AT 0230 ON [id number] BY FORSYTH K   Neutrophils Relative % 51  43 - 77 %   Lymphocytes Relative 18  12 - 46 %   Monocytes Relative 31 (*) 3 - 12 %   Eosinophils Relative 0  0 - 5 %   Basophils Relative 0  0 - 1 %   Neutro Abs 1.3 (*) 1.7 - 7.7 K/uL   Lymphs Abs 0.5 (*) 0.7 - 4.0 K/uL   Monocytes Absolute 0.8  0.1 - 1.0 K/uL   Eosinophils Absolute 0.0  0.0 - 0.7 K/uL   Basophils Absolute 0.0  0.0 - 0.1 K/uL   RBC Morphology POLYCHROMASIA PRESENT     WBC Morphology MILD LEFT SHIFT (1-5% METAS, OCC MYELO, OCC BANDS)     Smear Review PLATELET COUNT CONFIRMED BY SMEAR    COMPREHENSIVE METABOLIC PANEL     Status: Abnormal   Collection Time    02/22/13  1:56 AM      Result Value Range    Sodium 133 (*) 137 - 147 mEq/L   Comment: Please note change in reference range.   Potassium 3.7  3.7 - 5.3 mEq/L   Comment: Please note change in reference range.   Chloride 97  96 - 112 mEq/L   CO2 23  19 - 32 mEq/L   Glucose, Bld 105 (*) 70 - 99 mg/dL   BUN 16  6 - 23 mg/dL   Creatinine, Ser 9.14  0.50 - 1.35 mg/dL   Calcium 8.2 (*) 8.4 - 10.5 mg/dL   Total Protein 7.4  6.0 - 8.3 g/dL   Albumin 2.3 (*) 3.5 - 5.2 g/dL   AST 18  0 - 37 U/L   ALT 12  0 - 53 U/L   Alkaline Phosphatase 189 (*) 39 - 117 U/L   Total Bilirubin 2.8 (*) 0.3 - 1.2 mg/dL   GFR calc non Af Amer 90 (*) >90 mL/min   GFR calc Af Amer >90  >90 mL/min   Comment: (NOTE)     The eGFR has been calculated using the CKD EPI equation.     This calculation has not been validated in all clinical situations.     eGFR's persistently <90 mL/min signify possible Chronic Kidney     Disease.  APTT     Status: Abnormal   Collection Time    02/22/13  2:51 AM      Result Value Range   aPTT 40 (*) 24 - 37 seconds   Comment:            IF BASELINE aPTT IS ELEVATED,     SUGGEST PATIENT RISK ASSESSMENT     BE USED TO DETERMINE APPROPRIATE     ANTICOAGULANT THERAPY.  PROTIME-INR     Status: Abnormal   Collection Time    02/22/13  2:51 AM      Result Value Range   Prothrombin Time 17.6 (*) 11.6 - 15.2 seconds   INR 1.49  0.00 - 1.49  LACTATE DEHYDROGENASE  Status: None   Collection Time    02/22/13  2:51 AM      Result Value Range   LDH 148  94 - 250 U/L  RETICULOCYTES     Status: Abnormal   Collection Time    02/22/13  2:51 AM      Result Value Range   Retic Ct Pct 8.1 (*) 0.4 - 3.1 %   RBC. 1.96 (*) 4.22 - 5.81 MIL/uL   Retic Count, Manual 158.8  19.0 - 186.0 K/uL  TYPE AND SCREEN     Status: None   Collection Time    02/22/13  3:21 AM      Result Value Range   ABO/RH(D) A POS     Antibody Screen POS     Sample Expiration 02/25/2013     DAT, IgG       Value: POS     Performed at [facility]   PREPARE PLATELET PHERESIS     Status: None   Collection Time    02/22/13  3:21 AM      Result Value Range   Unit Number [id number]     Blood Component Type PLTPHER LRI1     Unit division 00     Status of Unit ISSUED     Transfusion Status OK TO TRANSFUSE    PREPARE RBC (CROSSMATCH)     Status: None   Collection Time    02/22/13  3:21 AM      Result Value Range   Order Confirmation ORDER PROCESSED BY BLOOD BANK    URINALYSIS, ROUTINE W REFLEX MICROSCOPIC     Status: Abnormal   Collection Time    02/22/13  3:29 AM      Result Value Range   Color, Urine RED (*) YELLOW   Comment: BIOCHEMICALS MAY BE AFFECTED BY COLOR   APPearance CLOUDY (*) CLEAR   Specific Gravity, Urine 1.015  1.005 - 1.030   pH 7.0  5.0 - 8.0   Glucose, UA 100 (*) NEGATIVE mg/dL   Hgb urine dipstick LARGE (*) NEGATIVE   Bilirubin Urine NEGATIVE  NEGATIVE   Ketones, ur 15 (*) NEGATIVE mg/dL   Protein, ur >643 (*) NEGATIVE mg/dL   Urobilinogen, UA >3.2 (*) 0.0 - 1.0 mg/dL   Nitrite POSITIVE (*) NEGATIVE   Leukocytes, UA MODERATE (*) NEGATIVE  URINE MICROSCOPIC-ADD ON     Status: None   Collection Time    02/22/13  3:29 AM      Result Value Range   RBC / HPF TOO NUMEROUS TO COUNT  <3 RBC/hpf      RADIOGRAPHY: No results found.     PATHOLOGY:    02/02/2013  Diagnosis Lymph node for lymphoma, right neck - MATURE B-CELL NEOPLASM WITH EXTENSIVE PLASMACYTIC DIFFERENTIATION, SEE COMMENT. Diagnosis Note Sections of the lymph node reveal complete effacement by monotonous sheets of plasma cells. The majority of the cells are mature in appearance with occasional prominent nucleoli and multinucleate forms. There are frequent mitotic figures and apoptotic debris imparting a somewhat "starry sky" pattern. Occasional large immunoblast like cells are seen admixed with the plasma cells. A panel of immunohistochemical studies were ordered. CD45 is diffusely and strongly positive in all cells, even the plasma  cell component. CD20 highlights scattered B-cells, with some larger immunoblast forms and many with irregular nuclear contours. CD138 demonstrates an extensive plasma cell component.CD79a is positive in the B-cells and plasma cells. The plasma cells are negative for CD56 and CD117. EMA is  focally positive in the plasma cells. Kappa and lambda light chains immunohistochemistry are both negative, which correlates with lack of surface or cytoplasmic light chains by flow cytometry. CD3, CD5, and CD43 highlight T-cells only. Bcl-6 and CD10 show only residual scattered cells. CD21 and CD23 fail to demonstrate residual follicular dendritic networks. CyclinD1 is negative. CD30 is positive within many of the large immunoblast like B-cells and occasional plasma cells. Bcl-2 demonstrates strong staining in T-cells with somewhat less intense staining in the B-cells. Ki-67 reveals a moderate proliferation rate (50%). Flow cytometry ([id number]) reveals a population of cells expressing CD19, CD38, and HLA-DR. There was no detectable expression of surface or cytoplasmic kappa or lambda light chains. The exact classification of this case is difficult given the extensive plasmacytic differentiation and many unusual features. Broadly, it appears to be a mature B-cell neoplasm with marked plasmacytic differentiation. The differential diagnosis includes a non-Hodgkin B-cell lymphoma with marked plasmacytic differentiation including marginal zone lymphoma (MZL), lymphoplasmacytic lymphoma (LPL), or less likely plasmablastic lymphoma versus a plasma cell neoplasm. The location of the tumor, isolated to lymph nodes, is unusual for for plasma cell myeloma (PCM) or plasmablastic lymphoma (PBL). There are no current mucosal lesions or history of HIV. Morphologically, there appears to be a predominant mature plasma cell component with a minor atypical B-cell component expressing bcl-2. This favors a MZL or LPL. While there is  increased 1 of 2 Amended copy Addendum FINAL for SHAFIN, POLLIO ([id number]) Diagnosis Note(continued) apoptosis and mitotic activity, the majority of plasma cells are mature making a PBL less likely. Phenotypically, the neoplasm lack light chain expression, which is unusual for a myeloma, but more often seen in MZL, LPL or PBL. Additionally, the neoplasm is unusual for PCM in the expression of CD45, CD19 and HLA-DR, with lack of CD56 or CD117. The phenotype would also be unusual for PBL due to expression of strong CD45. Overall, the neoplasm is currently best classified as a mature B-cell neoplasm with marked plasma cell differentiation, and a marginal zone or lymphoplasmacytic lymphoma is favored. Although less likely, given the possibility of a PBL and its aggressive nature, testing for Malachi Carl virus (EBER) (not available here) will be requested and reported in addendum. Additionally, a pax-5 immunohistochemistry (not available here) will be ordered and reported in an addendum. The case was discussed with Dellis Anes on 02/07/2013. ADDENDUM: Additional studies were performed at an outside institution. Immunohistochemistry for pax-5 reveals areas of B cells with irregular nuclear contours including some larger cells. EBV in situ (EBER) reveals only widely scattered positive lymphocytes. (JM:kh 02-15-13) Valinda Hoar MD Pathologist, Electronic Signature (Case signed 02/15/2013)    ASSESSMENT:  1. Acute exacerbation of thrombocytopenia with platelet count of <5,000 (previously 86,000 on 02/13/2013). Receiving 1 unit of pheresed platelets, ordered by admitting physician.  Secondary to immune-mediated response with + DAT and warm autoantibody of type+screen such as ITP versus MDS/Lymphoplastic lymphoma versus consumption from GU issue 2. Diffuse petechial rash 3. Macrocytic anemia, mildly below baseline, 1 unit PRBC ordered by admitting physician. 4. Leukopenia, adequate ANC 5.  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  Diagnosed via right anterior cervical chain lymph node biopsy by Dr. Malvin Johns on 02/03/2013. 6. Right neck lymphadenopathy 7. MDS with normal cytogenetics possibly consistent with a disease process consistent with RAEB-1 or CMMOL. 8. Pancytopenia, secondary to #7 and #5 9. Active bleeding 10. Hematuria 11. Hemodynamically stable 12. Warm autoantibody  and + DAT on type and screen. 13. Elevated IgG and IgA- polyclonal elevation.  Negative cryoglobulins.  Patient Active Problem List   Diagnosis Date Noted  . Thrombocytopenia 02/22/2013  . Petechiae 02/22/2013  . Anemia 02/22/2013  . Hematuria 02/22/2013  . Crohn's ileitis 12/20/2012  . Heme positive stool 10/13/2012  . MDS (myelodysplastic syndrome) 01/01/2012  . Pulmonary nodules 04/01/2011  . Pancytopenia 02/23/2011  . BARRETTS ESOPHAGUS 04/22/2010  . Pouchitis 09/18/2008  . TOBACCO ABUSE 10/27/2007  . COLONIC POLYPS 10/25/2007  . ANEMIA 10/25/2007  . THROMBOCYTOPENIA 10/25/2007  . LEUKOPENIA, MILD 10/25/2007  . ULCERATIVE COLITIS 10/25/2007  . DIARRHEA 10/25/2007  . PULMONARY EMBOLISM, HX OF 10/25/2007  . BENIGN PROSTATIC HYPERTROPHY, HX OF 10/25/2007     PLAN:  1. Urology consult for hematuria 2. Acute exacerbation of thrombocytopenia unclear etiology with precipitous fall in platelet count compared to 12/22 when platelets were 86,000.  Possibilities include an immune-mediated response with a warm autoantibody and + DAT on type and screen versus MDS/Lympoplastic Lymphoma versus consumption from GU bleeding/issue.  Platelets have been administered.  STAT CBC diff ordered following completion of platelets.  Will start 100 mg of IV Solu-cortef every 6 hours for possible immune-mediated response such as ITP.  If no response to steroids, will need to consider Rituxumab IV which will  quiet the possible immune response and treat lymphoplastic lymphoma.   3. Appt at [facility] on 03/03/2012. 4. Will follow along while an inpatient.  All questions were answered. The patient knows to call the clinic with any problems, questions or concerns. We can certainly see the patient much sooner if necessary.  Patient and plan discussed with Dr. Alla German and he is in agreement with the aforementioned.    KEFALAS,THOMAS   Pager: [phone]

## 2013-02-22 NOTE — Progress Notes (Signed)
Rapid response called at approximately 1850, then cancelled. Stayed with Dr. Mahala Menghini and primary RN to assist in stablizing patient before transfer to [facility]. Verbal orders given to obtain 3rd IV access obtained by L. Covington,RN. Patient given ~800cc bolus then fluids decreased to 500cc/hr. 1 unit of PRBCs given at 250cc/hr per Dr. Mahala Menghini. Vital signs per flow sheet. [facility] present and care given back over to primary RN.

## 2013-02-22 NOTE — Progress Notes (Signed)
[facility] on phone to receive report.  Gave report.  Verbalized understanding.  [facility] staff stated they were about twenty minutes out. Will continue to monitor. Schonewitz, Candelaria Stagers 02/22/2013

## 2013-02-22 NOTE — ED Provider Notes (Signed)
CSN: [id number]     Arrival date & time 02/22/13  0128 History   First MD Initiated Contact with Patient 02/22/13 0243     Chief Complaint  Patient presents with  . 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 He has been known to require blood transfusions fairly frequently over the last several months. He states that today his chief complaint is bleeding. He has a small area that is bleeding at the inferior aspect of his prior colostomy exploratory laparotomy incision. He placed a bandage over this and then noted that he was having hematuria. He also notes having mild intermittent nose bleeds which are very small amounts of blood. There is no rectal bleeding, no abdominal pain cough shortness of breath fevers chills nausea or vomiting.  Patient is a 66 y.o. male presenting with hematuria. The history is provided by the patient, the spouse and medical records.  Hematuria    Past Medical History  Diagnosis Date  . Ulcerative colitis 2004    with IPPA  . 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 History of DVT (deep vein thrombosis)     and PE  . Barrett's esophagus 02/2010    on EGD  . Vitamin B 12 deficiency   . BPH (benign prostatic hyperplasia)   . GERD (gastroesophageal reflux disease)   . Pulmonary nodules 04/01/2011  . Blood dyscrasia     dms,    per Dr. Leonia Corona  . COPD (chronic obstructive pulmonary disease)     emphasema,   xray done and 4 small areas found on test  . 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    Marland Kitchen Shortness of breath     with exertion  . Anxiety    Past Surgical History  Procedure Laterality Date  . Knee surgery      Left  . Abdominoperineal proctocolectomy  2004    with  ileal anastomosis and anal pouch creation at [facility]  . Colonoscopy  11/22/2006    ULCERATIVE COLITIS  . Transurethral resection of prostate  02/03/2011    Procedure: TRANSURETHRAL RESECTION OF THE PROSTATE (TURP);  Surgeon: Ky Barban;  Location: [facility];  Service: Urology;  Laterality: N/A;  . Ileoscopy  03/14/2010    geographic ulceratios of the ileal pouch c/w pouchitis, more proximal distal ileum mucosa normal, biospy from ulcer benign  . Esophagogastroduodenoscopy  03/14/10    barret's esophagus, small hh, erosive duodenitis  . Colectomy    . Esophagogastroduodenoscopy  05/29/2011    SLF: Barrett's esophagus  6-7 CM SEGNMENT/ Moderate gastritis/ MILD Duodenitis/ MODERATE Hiatal hernia, Surveillance due 2016  . Flexible sigmoidoscopy N/A 11/11/2012    NWG:NFAOZH IN THE SMALL BOWEL AND POUCH ANAL CANAL STENOSED TO 10 cm  . Mass excision Right 02/02/2013    Procedure: EXCISION CERVICAL LYMPHNODES RIGHT NECK;  Surgeon: Marlane Hatcher, MD;  Location: [facility];  Service: General;  Laterality: Right;   Family History  Problem Relation Age of Onset  . Anesthesia problems Neg Hx   . Hypotension Neg Hx   . Malignant hyperthermia Neg Hx   . Pseudochol deficiency Neg Hx    History  Substance Use Topics  . Smoking status: Current Every Day Smoker -- 0.50 packs/day for 50 years    Types: Cigarettes    Last Attempt  to Quit: 03/23/2011  . Smokeless tobacco: Former Neurosurgeon    Quit date: 04/12/2011  . Alcohol Use: No    Review of Systems  Genitourinary: Positive for hematuria.  All other systems reviewed and are negative.    Allergies  Review of patient's allergies indicates no known allergies.  Home Medications   Current Outpatient Rx  Name  Route  Sig  Dispense  Refill  . cyanocobalamin (,VITAMIN B-12,) 1000 MCG/ML injection   Intramuscular   Inject 1,000 mcg into the muscle every 3 (three) months.          . dicyclomine (BENTYL) 10 MG capsule   Oral   Take 10-20 mg by  mouth 3 (three) times daily before meals.         . lactase (LACTAID) 3000 UNITS tablet   Oral   Take 1 tablet by mouth daily as needed.         . loperamide (IMODIUM) 2 MG capsule   Oral   Take 2 mg by mouth 3 (three) times daily. Takes with Dicyclomine         . LORazepam (ATIVAN) 0.5 MG tablet   Oral   Take 1 tablet (0.5 mg total) by mouth every 12 (twelve) hours as needed for anxiety.   30 tablet   0     Future refills from Primary Care Physician   . Mesalamine (ASACOL HD) 800 MG TBEC   Oral   Take 2 tablets (1,600 mg total) by mouth 3 (three) times daily.   180 tablet   3   . mesalamine (CANASA) 1000 MG suppository   Rectal   Place 1 suppository (1,000 mg total) rectally 2 (two) times daily. For 10 days   30 suppository   1   . Multiple Vitamin (MULITIVITAMIN WITH MINERALS) TABS   Oral   Take 1 tablet by mouth daily.         . pantoprazole (PROTONIX) 40 MG tablet   Oral   Take 1 tablet (40 mg total) by mouth daily.   90 tablet   3   . vitamin C (ASCORBIC ACID) 500 MG tablet   Oral   Take 500 mg by mouth daily.           Marland Kitchen ZOSTAVAX 16109 UNT/0.65ML injection                BP 120/73  Pulse 105  Temp(Src) 97.7 F (36.5 C) (Oral)  Resp 16  Ht 5\' 7"  (1.702 m)  Wt 143 lb (64.864 kg)  BMI 22.39 kg/m2  SpO2 99% Physical Exam  Nursing note and vitals reviewed. Constitutional: He appears well-developed and well-nourished. No distress.  HENT:  Head: Normocephalic and atraumatic.  Mouth/Throat: Oropharynx is clear and moist. No oropharyngeal exudate.  Eyes: Conjunctivae and EOM are normal. Pupils are equal, round, and reactive to light. Right eye exhibits no discharge. Left eye exhibits no discharge. No scleral icterus.  Neck: Normal range of motion. Neck supple. No JVD present. No thyromegaly present.  Cardiovascular: Regular rhythm, normal heart sounds and intact distal pulses.  Exam reveals no gallop and no friction rub.   No murmur  heard. Mild tachycardia  Pulmonary/Chest: Effort normal and breath sounds normal. No respiratory distress. He has no wheezes. He has no rales.  Abdominal: Soft. Bowel sounds are normal. He exhibits no distension and no mass. There is no tenderness.  Genitourinary:  No blood at the urethral meatus  Musculoskeletal: Normal range of motion. He exhibits no  edema and no tenderness.  Lymphadenopathy:    He has no cervical adenopathy.  Neurological: He is alert. Coordination normal.  Skin: Skin is warm and dry. No rash noted. No erythema.  Several areas of bruising, no petechiae or purpura. Very small pinpoint area of bleeding from the inferior aspect of the prior surgical scar of the abdomen in the suprapubic area  Psychiatric: He has a normal mood and affect. His behavior is normal.    ED Course  Procedures (including critical care time) Labs Review Labs Reviewed  CBC WITH DIFFERENTIAL - Abnormal; Notable for the following:    WBC 2.6 (*)    RBC 2.15 (*)    Hemoglobin 7.7 (*)    HCT 23.3 (*)    MCV 108.4 (*)    MCH 35.8 (*)    RDW 22.5 (*)    Platelets <5 (*)    Monocytes Relative 31 (*)    Neutro Abs 1.3 (*)    Lymphs Abs 0.5 (*)    All other components within normal limits  COMPREHENSIVE METABOLIC PANEL - Abnormal; Notable for the following:    Sodium 133 (*)    Glucose, Bld 105 (*)    Calcium 8.2 (*)    Albumin 2.3 (*)    Alkaline Phosphatase 189 (*)    Total Bilirubin 2.8 (*)    GFR calc non Af Amer 90 (*)    All other components within normal limits  URINALYSIS, ROUTINE W REFLEX MICROSCOPIC  APTT  PROTIME-INR  LACTATE DEHYDROGENASE  VITAMIN B12  FOLATE  IRON AND TIBC  FERRITIN  RETICULOCYTES   Imaging Review No results found.  EKG Interpretation   None       MDM   1. Thrombocytopenia   2. Anemia   3. Hematuria    The patient has a recurrent anemia, this is similar to prior values with a hemoglobin of 7.7, MCV of 108. His electrolytes appear within  normal limits, bilirubin is high at 2.8 and his platelets are undetectable low. The patient is having active small amounts of bleeding, he will need admission to the hospital and further evaluation and treatment of his severe thrombocytopenia.  The patient's care was discussed with Dr. Rito Ehrlich on for the hospitalist service, the anemia and thrombocytopenia were discussed, electrolytes are okay, urinalysis pending  Vida Roller, MD 02/22/13 [phone]

## 2013-02-22 NOTE — Progress Notes (Addendum)
9:30 AM I agree with HPI/GPe and A/P per Dr. 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  It is noted that he also has Warm [place] +DAT He has been seen at [facility] for multiple issues and has an appointment 03/03/13 however this afternoon started having profuse rectal bleeding, significant clots impeding his passage of urine,and is in some mild distress. Hb 7.7-->6.7, Wbc 2/6-->1.6, PLT remain <5 NO fever chills BP 132/82  Pulse 118  Temp(Src) 98 F (36.7 C) (Oral)  Resp 24  Ht 5\' 7"  (1.702 m)  Wt 64.864 kg (143 lb)  BMI 22.39 kg/m2  SpO2 99%   I spoke to oncology PA who recommends no further platelets at this point but instead has placed him on Solu-Cortef q6 When the bleeding became brisk, it was decided to ultimately give 2 more units of PRBC + Platelets He has 2 Iv sites present  Discussed with Dr. 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  Recommends OR rectoscope [insturment used during TURP] for visualization of bladder and cautery when patient more stable.  Recommends continuous irrigation of bladder until this can definitively be done.  He subsequently at 1830 became orthostatic and had a syncopal episode with  B 60/40, was clammy and sweating and was placed in anti-trendelenburg.  IVF bolus continuous was started, 1 U PRBC was transfused /1 hour, Patients BP on my last assessment resolved to 120/84 and patient felt better.  Patinet was emergently transported by [facility] to  [facility]  Lastly I spoke to Dr. Jenita Seashore and with Dr. Asher Muir hematology] who accepts patient to the Hem-Onc B service at [facility]-Greatly appreciate input  Patient Active Problem List   Diagnosis Date Noted  . Thrombocytopenia 02/22/2013  . Petechiae 02/22/2013  . Anemia 02/22/2013  . Hematuria 02/22/2013  . Crohn's ileitis 12/20/2012  . Heme positive stool 10/13/2012  . MDS (myelodysplastic syndrome) 01/01/2012  . Pulmonary nodules 04/01/2011  . Pancytopenia 02/23/2011  . BARRETTS ESOPHAGUS 04/22/2010  . Pouchitis 09/18/2008  . TOBACCO ABUSE 10/27/2007  . COLONIC POLYPS 10/25/2007  . ANEMIA 10/25/2007  . THROMBOCYTOPENIA 10/25/2007  . LEUKOPENIA, MILD 10/25/2007  . ULCERATIVE COLITIS 10/25/2007  . DIARRHEA 10/25/2007  . PULMONARY EMBOLISM, HX OF 10/25/2007  . BENIGN PROSTATIC HYPERTROPHY, HX OF 10/25/2007   Pleas Koch, MD [facility] [phone]

## 2013-02-22 NOTE — Progress Notes (Signed)
Called to room by wife stating patient needed help immediately.  Found wife by the sink holding patient up.  Patient was pale, diaphoretic, and unresponsive with a blank stare on his face.  Called out for assistance.  Staff arrived and helped assist patient back to bed.  MD paged. Patient alert and oriented now, questioning what happened.  Manual BP 68/40.  MD present and ordered stat liter bolus of normal saline. [facility] calling in with room number as patient was to be transferred to facility for further treatment.  Other RNs and MD present with patient.  Gave [facility] demographics.  Called [facility] for transport.  Schonewitz, Candelaria Stagers 02/22/2013

## 2013-02-22 NOTE — H&P (Signed)
[facility] History and Physical  Mark Morrison ZOX:[id number] DOB: Nov 17, 1945 DOA: 02/22/2013   PCP: Londell Moh, MD  Specialists: Followed by the Cancer Clinic at [facility]  Chief Complaint: Bleeding from the skin and blood in the urine  HPI: Mark Morrison is a 67 y.o. male with a past medical history of myelodysplastic syndrome, who was recently diagnosed with lymphoma after a lymph node biopsy, history of proctitis, and inflammatory bowel disease, who was in his usual state of health till yesterday evening when he started noticing that there was a spot on his skin in the abdomen, which was bleeding and oozing blood. It persisted through the night and then he also noticed some blood in the urine. This concerned him and so he decided to come in to the hospital. Denies any blood in the stools. Denies any dizziness or lightheadedness, but has been feeling weak. He said he was dizzy, due to low blood counts during Thanksgiving and required blood transfusion at that time. He also has a lymphadenopathy in the cervical lymph nodes on the right side and underwent a lymph node biopsy, which showed lymphoma. Patient has been referred to [facility] for further management. He is not receiving any chemotherapy at this time. He does get [name] injections for anemia. He denies any falls or injuries. No head injury, specifically.  Home Medications: The list needs to be verified. Prior to Admission medications   Medication Sig Start Date End Date Taking? Authorizing Provider  cyanocobalamin (,VITAMIN B-12,) 1000 MCG/ML injection Inject 1,000 mcg into the muscle every 3 (three) months.     Historical Provider, MD  dicyclomine (BENTYL) 10 MG capsule Take 10-20 mg by mouth 3 (three) times daily before meals.    Historical Provider, MD  lactase (LACTAID) 3000 UNITS tablet Take 1 tablet by mouth daily as needed.    Historical Provider, MD  loperamide (IMODIUM) 2 MG capsule Take 2 mg by mouth 3  (three) times daily. Takes with Dicyclomine    Historical Provider, MD  LORazepam (ATIVAN) 0.5 MG tablet Take 1 tablet (0.5 mg total) by mouth every 12 (twelve) hours as needed for anxiety. 07/01/12   Ellouise Newer, PA-C  Mesalamine (ASACOL HD) 800 MG TBEC Take 2 tablets (1,600 mg total) by mouth 3 (three) times daily. 12/19/12   Nira Retort, NP  mesalamine (CANASA) 1000 MG suppository Place 1 suppository (1,000 mg total) rectally 2 (two) times daily. For 10 days 12/19/12   Nira Retort, NP  Multiple Vitamin (MULITIVITAMIN WITH MINERALS) TABS Take 1 tablet by mouth daily.    Historical Provider, MD  pantoprazole (PROTONIX) 40 MG tablet Take 1 tablet (40 mg total) by mouth daily. 10/11/12   Nira Retort, NP  vitamin C (ASCORBIC ACID) 500 MG tablet Take 500 mg by mouth daily.      Historical Provider, MD  ZOSTAVAX 81191 UNT/0.65ML injection  10/10/12   Historical Provider, MD    Allergies: No Known Allergies  Past Medical History: Past Medical History  Diagnosis Date  . Ulcerative colitis 2004    with IPPA  . 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 History of DVT (deep vein thrombosis)     and PE  . Barrett's esophagus 02/2010    on EGD  . Vitamin B 12 deficiency   . BPH (benign prostatic hyperplasia)   . GERD (gastroesophageal reflux disease)   . Pulmonary nodules 04/01/2011  . Blood dyscrasia     dms,  per Dr. Leonia Corona  . COPD (chronic obstructive pulmonary disease)     emphasema,   xray done and 4 small areas found on test  . 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    Marland Kitchen Shortness of breath     with exertion  . Anxiety     Past Surgical History  Procedure Laterality Date  . Knee surgery      Left  . Abdominoperineal proctocolectomy  2004    with ileal anastomosis and anal pouch creation at [facility]  . Colonoscopy  11/22/2006    ULCERATIVE COLITIS  . Transurethral resection of prostate   02/03/2011    Procedure: TRANSURETHRAL RESECTION OF THE PROSTATE (TURP);  Surgeon: Ky Barban;  Location: [facility];  Service: Urology;  Laterality: N/A;  . Ileoscopy  03/14/2010    geographic ulceratios of the ileal pouch c/w pouchitis, more proximal distal ileum mucosa normal, biospy from ulcer benign  . Esophagogastroduodenoscopy  03/14/10    barret's esophagus, small hh, erosive duodenitis  . Colectomy    . Esophagogastroduodenoscopy  05/29/2011    SLF: Barrett's esophagus  6-7 CM SEGNMENT/ Moderate gastritis/ MILD Duodenitis/ MODERATE Hiatal hernia, Surveillance due 2016  . Flexible sigmoidoscopy N/A 11/11/2012    NWG:NFAOZH IN THE SMALL BOWEL AND POUCH ANAL CANAL STENOSED TO 10 cm  . Mass excision Right 02/02/2013    Procedure: EXCISION CERVICAL LYMPHNODES RIGHT NECK;  Surgeon: Marlane Hatcher, MD;  Location: [facility];  Service: General;  Laterality: Right;    Social History: Patient lives with his wife. He smokes half pack of cigarettes on a daily basis. No alcohol use. No illicit drug use. Independent with daily activities.  Family History:  Family History  Problem Relation Age of Onset  . Anesthesia problems Neg Hx   . Hypotension Neg Hx   . Malignant hyperthermia Neg Hx   . Pseudochol deficiency Neg Hx   He tells me that there is heart disease in the family, but was unable to specify  Review of Systems - History obtained from the patient General ROS: positive for  - fatigue Psychological ROS: negative Ophthalmic ROS: negative ENT ROS: negative Allergy and Immunology ROS: negative Hematological and Lymphatic ROS: as in hpi Endocrine ROS: negative Respiratory ROS: no cough, shortness of breath, or wheezing Cardiovascular ROS: no chest pain or dyspnea on exertion Gastrointestinal ROS: no abdominal pain, change in bowel habits, or black or bloody stools Genito-Urinary ROS: as in hpi Musculoskeletal ROS: negative Neurological ROS: no TIA or stroke symptoms Dermatological  ROS: as in hpi  Physical Examination  Filed Vitals:   02/22/13 0140  BP: 120/73  Pulse: 105  Temp: 97.7 F (36.5 C)  TempSrc: Oral  Resp: 16  Height: 5\' 7"  (1.702 m)  Weight: 64.864 kg (143 lb)  SpO2: 99%    General appearance: alert, cooperative, appears stated age and no distress Head: Normocephalic, without obvious abnormality, atraumatic Eyes: conjunctivae/corneas clear. PERRL, EOM's intact.  Throat: lips, mucosa, and tongue normal; teeth and gums normal Neck: marked anterior cervical adenopathy, no carotid bruit, no JVD, supple, symmetrical, trachea midline and thyroid not enlarged, symmetric, no tenderness/mass/nodules Resp: clear to auscultation bilaterally Cardio: regular rate and rhythm, S1, S2 normal, no murmur, click, rub or gallop GI: soft, non-tender; bowel sounds normal; no masses,  no organomegaly and small area in lower abndomen with oozing of blood from skin. No obvious lesion noted. Extremities: extremities normal, atraumatic,  no cyanosis or edema Pulses: 2+ and symmetric Skin: petechiae noted bilateral LE Lymph nodes: Cervical adenopathy: multiple enlarged LN's on right Neurologic: No focal deficits.  Laboratory Data: Results for orders placed during the hospital encounter of 02/22/13 (from the past 48 hour(s))  CBC WITH DIFFERENTIAL     Status: Abnormal   Collection Time    02/22/13  1:56 AM      Result Value Range   WBC 2.6 (*) 4.0 - 10.5 K/uL   RBC 2.15 (*) 4.22 - 5.81 MIL/uL   Hemoglobin 7.7 (*) 13.0 - 17.0 g/dL   HCT 16.1 (*) 09.6 - 04.5 %   MCV 108.4 (*) 78.0 - 100.0 fL   MCH 35.8 (*) 26.0 - 34.0 pg   MCHC 33.0  30.0 - 36.0 g/dL   RDW 40.9 (*) 81.1 - 91.4 %   Platelets <5 (*) 150 - 400 K/uL   Comment: RESULT REPEATED AND VERIFIED     PLATELET COUNT CONFIRMED BY SMEAR     CRITICAL RESULT CALLED TO, READ BACK BY AND VERIFIED WITH:     KOEHART K AT 0230 ON [id number] BY FORSYTH K   Neutrophils Relative % 51  43 - 77 %   Lymphocytes Relative 18  12  - 46 %   Monocytes Relative 31 (*) 3 - 12 %   Eosinophils Relative 0  0 - 5 %   Basophils Relative 0  0 - 1 %   Neutro Abs 1.3 (*) 1.7 - 7.7 K/uL   Lymphs Abs 0.5 (*) 0.7 - 4.0 K/uL   Monocytes Absolute 0.8  0.1 - 1.0 K/uL   Eosinophils Absolute 0.0  0.0 - 0.7 K/uL   Basophils Absolute 0.0  0.0 - 0.1 K/uL   RBC Morphology POLYCHROMASIA PRESENT     WBC Morphology MILD LEFT SHIFT (1-5% METAS, OCC MYELO, OCC BANDS)     Smear Review PLATELET COUNT CONFIRMED BY SMEAR    COMPREHENSIVE METABOLIC PANEL     Status: Abnormal   Collection Time    02/22/13  1:56 AM      Result Value Range   Sodium 133 (*) 137 - 147 mEq/L   Comment: Please note change in reference range.   Potassium 3.7  3.7 - 5.3 mEq/L   Comment: Please note change in reference range.   Chloride 97  96 - 112 mEq/L   CO2 23  19 - 32 mEq/L   Glucose, Bld 105 (*) 70 - 99 mg/dL   BUN 16  6 - 23 mg/dL   Creatinine, Ser 2.13  0.50 - 1.35 mg/dL   Calcium 8.2 (*) 8.4 - 10.5 mg/dL   Total Protein 7.4  6.0 - 8.3 g/dL   Albumin 2.3 (*) 3.5 - 5.2 g/dL   AST 18  0 - 37 U/L   ALT 12  0 - 53 U/L   Alkaline Phosphatase 189 (*) 39 - 117 U/L   Total Bilirubin 2.8 (*) 0.3 - 1.2 mg/dL   GFR calc non Af Amer 90 (*) >90 mL/min   GFR calc Af Amer >90  >90 mL/min   Comment: (NOTE)     The eGFR has been calculated using the CKD EPI equation.     This calculation has not been validated in all clinical situations.     eGFR's persistently <90 mL/min signify possible Chronic Kidney     Disease.  APTT     Status: Abnormal   Collection Time    02/22/13  2:51 AM  Result Value Range   aPTT 40 (*) 24 - 37 seconds   Comment:            IF BASELINE aPTT IS ELEVATED,     SUGGEST PATIENT RISK ASSESSMENT     BE USED TO DETERMINE APPROPRIATE     ANTICOAGULANT THERAPY.  PROTIME-INR     Status: Abnormal   Collection Time    02/22/13  2:51 AM      Result Value Range   Prothrombin Time 17.6 (*) 11.6 - 15.2 seconds   INR 1.49  0.00 - 1.49  LACTATE  DEHYDROGENASE     Status: None   Collection Time    02/22/13  2:51 AM      Result Value Range   LDH 148  94 - 250 U/L  RETICULOCYTES     Status: Abnormal   Collection Time    02/22/13  2:51 AM      Result Value Range   Retic Ct Pct 8.1 (*) 0.4 - 3.1 %   RBC. 1.96 (*) 4.22 - 5.81 MIL/uL   Retic Count, Manual 158.8  19.0 - 186.0 K/uL    Radiology Reports: No results found.  Problem List  Principal Problem:   Thrombocytopenia Active Problems:   MDS (myelodysplastic syndrome)   Petechiae   Anemia   Hematuria   Assessment: This is a 67 year old, Caucasian male, who presents with oozing of blood from skin in the lower abdominal wall, and has noted hematuria. A urinalysis is pending at this time. But the patient is noted to be anemic and quite significantly thrombocytopenic. Etiology for his lab abnormalities is not clear. He does have a history of myelodysplastic syndrome. However, reason for the acute drop in platelet count is unclear.   Plan: #1 severe thrombocytopenia with Petechiae and mild bleeding: He is hemodynamically stable. He'll be transfused platelets. Oncology will be consulted to see him in the morning.  #2 macrocytic anemia: Hemoglobin is lower than what it was about a week ago. He will be transfused 1 unit of PRBC. He underwent a flexible sigmoidoscopy about 3 months ago, which revealed a few ulcers in the small bowel pouch. Does not seem to have active GI bleeding at this time. Anemia is most likely due to oozing of blood from the skin site. Check fecal occult blood testing.  #3 hematuria: UA Will be obtained. This is most likely due to severe thrombocytopenia. Further management depending on how the patient does after transfusion. He has followed with Dr. Jerre Simon for prostate issues in the past.  #4 history of myelodysplastic syndrome: Management per oncology.  #5 recently diagnosed lymphoma with cervical lymphadenopathy: Management per oncology. He has an  appointment at [facility] in the near future.  #6 history of COPD with tobacco abuse: Continue to monitor. Albuterol nebulizers as needed.  DVT Prophylaxis: SCDs Code Status: Full code Family Communication: Discussed with the patient and his wife  Disposition Plan: Admit to telemetry   Further management decisions will depend on results of further testing and patient's response to treatment.  [facility]  [facility] Pager [phone]  If 7PM-7AM, please contact night-coverage [email or web address] Password [facility]  02/22/2013, 3:32 AM

## 2013-02-22 NOTE — Progress Notes (Signed)
Utilization Review Complete  

## 2013-02-22 NOTE — ED Notes (Signed)
Pt reports "blood is coming out of his skin and he is peeing blood" - pt admits to hx of lymphoma - pt denies any lightheadedness or dizziness - pt concerned his "blood is low"

## 2013-02-22 NOTE — Progress Notes (Signed)
Orders to infuse one unit of platelets and one unit of PRBC.  The platelets have to go to [facility] for irradiation and his blood has to be sent to [facility] to identify antibodies before infusing.  Dr. Rito Ehrlich made aware,  No need for emergency release of blood at this time.  Will continue to monitor.

## 2013-02-22 NOTE — Progress Notes (Signed)
Called report to Dahlia Client, Charity fundraiser at [employer].  Verbalized understanding.  Pt transferred to facility via [facility] is safe and stable condition per Dr. Mahala Menghini. Schonewitz, Candelaria Stagers 02/22/2013

## 2013-02-22 NOTE — Progress Notes (Signed)
CRITICAL VALUE ALERT  Critical value received:  hgb 6.7, platelets <5,000 Date of notification:  02/22/2013  Time of notification:  1000  Critical value read back:yes  Nurse who received alert:  Zavior Thomason anne schonewitz, rn     MD notified (1st page):  samtani   Time of first page:  1010  MD notified (2nd page):  Time of second page:  Responding MD:  samtani  Time MD responded:  1010

## 2013-02-22 NOTE — Progress Notes (Signed)
i was called in he is having hemeturia they can not get a catheter in i came i was able to put 3way f22 foleh started cbi after hand irrigating and got some clots out it is running now.  He was getting a unit of blood and after that he will get 2 more units i was told he will be transferred to [facility] for further care.

## 2013-02-22 NOTE — Consult Note (Signed)
Note dictated

## 2013-02-23 LAB — TYPE AND SCREEN
ABO/RH(D): A POS
Antibody Screen: POSITIVE
DAT, IgG: POSITIVE
Unit division: 0
Unit division: 0

## 2013-02-23 LAB — PREPARE PLATELET PHERESIS
Unit division: 0
Unit division: 0

## 2013-02-24 ENCOUNTER — Other Ambulatory Visit (HOSPITAL_COMMUNITY): Payer: Self-pay | Admitting: Hematology and Oncology

## 2013-02-24 ENCOUNTER — Ambulatory Visit (HOSPITAL_COMMUNITY): Payer: Medicare Other

## 2013-02-24 NOTE — Consult Note (Signed)
NAME:  Mark Morrison, [name]                ACCOUNT NO.:  [id number]  MEDICAL RECORD NO.:  [id number]  LOCATION:                                 FACILITY:  PHYSICIAN:  Marissa Nestle, M.D.DATE OF BIRTH:  1945/06/11  DATE OF CONSULTATION:  02/22/2013 DATE OF DISCHARGE:  02/22/2013                                CONSULTATION   HISTORY OF PRESENT ILLNESS:  A 68 year old gentleman.  I was called in to see him because he has hematuria.  He has a history of having myelodysplastic syndrome.  He has been diagnosed with lymphoma after lymph node biopsy, history of proctitis, inflammatory bowel disease, and he has also noticed some bleeding from skin where he used to have colostomy site.  Well-healed scar now is bleeding and oozing blood.  He is also passing blood in his urine.  I have done TUR prostate for BPH several years ago and likely he has done well after that.  He has required several blood transfusions in the last several years, but he denies any voiding difficulty.  He voids satisfactorily, but there is blood in it.  PAST MEDICAL HISTORY: 1. Ulcerative colitis. 2. History of DVT and pulmonary embolism in 2012. 3. COPD. 4. Myelodysplastic syndrome diagnosed in 2013. 5. Anxiety.  PAST SURGICAL HISTORY:  Surgery on his left knee, abdominoperineal proctocolectomy in 2004 done in [place] in 2004.  TUR prostate for urinary retention, was found to have BPH in 2012.  SOCIAL HISTORY:  He smokes half pack of cigarettes daily.  No alcohol use.  No illicit drug use.  FAMILY HISTORY:  Negative.  REVIEW OF SYSTEMS:  Unremarkable.  PHYSICAL EXAMINATION:  GENERAL:  On examination, moderately built male, not in acute distress, fully conscious, alert, oriented. VITAL SIGNS:  Blood pressure 120/73, temperature 97.7. ABDOMEN:  Soft, flat.  There is scar in the midline with dressing, which is blood soaked in the lower part of the midline scar. GU:  External genitalia is unremarkable. RECTAL:   Deferred. EXTREMITIES:  Normal.  IMPRESSION:  Gross hematuria probably because of his platelets less than 5000.  He is bleeding because of his low platelets.  His BUN is 16 and creatinine 0.79.  I will wait and see if he is waiting to have blood transfusion and platelet transfusion today.  If he continues to bleed only then he probably will need cystoscopy.  At this point, I am not going to do any workup because I assume that it is because of his myelodysplastic syndrome and thrombocytopenia.  He is being followed by his hematologist here.  I do not need to see him daily.     Marissa Nestle, M.D.     MIJ/MEDQ  D:  02/22/2013  T:  02/23/2013  Job:  [id number]

## 2013-02-27 ENCOUNTER — Ambulatory Visit (HOSPITAL_COMMUNITY): Payer: Medicare Other

## 2013-02-27 ENCOUNTER — Other Ambulatory Visit (HOSPITAL_COMMUNITY): Payer: Medicare Other

## 2013-03-09 ENCOUNTER — Telehealth: Payer: Self-pay

## 2013-03-09 NOTE — Telephone Encounter (Signed)
Patient past away @ Home per Obituary in [place] [employer] °

## 2013-03-20 ENCOUNTER — Other Ambulatory Visit (HOSPITAL_COMMUNITY): Payer: Self-pay | Admitting: Oncology

## 2013-03-26 NOTE — Discharge Summary (Signed)
Physician Discharge Summary  Mark Morrison:[id number] DOB: 19-Sep-1945 DOA: 02/22/2013  PCP: Horatio Pel, MD  Admit date: 02/22/2013 Discharge date: 03-26-2013  Time spent: 70 minutes  Recommendations for Outpatient Follow-up:  1. Needs Tertiary level Hematology-Oncology care-Patient accepted to [facility] under care of Dr. 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 It is noted that he also has [place] +DAT   He has been seen at [facility] for multiple issues and has an appointment 03/03/13 however started having profuse rectal bleeding, significant clots impeding his passage of urine,and is in some mild distress.   Hb 7.7-->6.7, Wbc 2/6-->1.6, PLT remain <5  NO fever chills  BP 132/82  Pulse 118  Temp(Src) 98 F (36.7 C) (Oral)  Resp 24  Ht 5\' 7"  (1.702 m)  Wt 64.864 kg (143 lb)  BMI 22.39 kg/m2  SpO2 99%   I spoke to oncology PA who recommends no further platelets at this point but instead has placed him on Solu-Cortef q6  When the bleeding became brisk, it was decided to ultimately give 2 more units of PRBC + Platelets  He has 2 Iv sites present   Discussed with Dr. 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 Recommends OR rectoscope [instrument used during TURP] for visualization of bladder and cautery when patient more stable. Recommends continuous irrigation of bladder until this can definitively be done.   He subsequently at 1830 became orthostatic and had a syncopal episode with BP 60/40, was clammy and sweating and was placed in anti-trendelenburg. IVF bolus continuous was started, 1 U PRBC was transfused /1 hour, Patients BP on my last assessment resolved to 120/84 and patient felt better.   Patinet was emergently transported by [facility]  Lastly I spoke to Dr. Romero Belling and with Dr. Leticia Clas hematology] who accepts patient to the Hem-Onc B service at [facility]-Greatly appreciate input   Hospital Course:  See above  Procedures:  Multiple (i.e. Studies not automatically included, echos, thoracentesis, etc; not x-rays)  Consultations:  Urology  Oncology  Telephone consulted [facility] who accepted him in transfer  Discharge Exam: Filed Vitals:   02/22/13 1942  BP: 112/78  Pulse: 107  Temp:   Resp:     General: weak but stable Cardiovascular: s1s 2 no m/r/g Respiratory: clear   Discharge Instructions  Discharge Orders   Future Appointments Provider Department Dept Phone   March 26, 2013 2:20 PM [place] [phone]   03/26/13 3:00 PM [facility] [place] [phone]   03/31/2013 8:00 AM [facility] [address] [place] [address] IMAGING [phone]   Liquids only 4 hours prior to your exam. Any medications can be taken as usual. Please arrive 15 min prior to your scheduled exam time.  Future Orders Complete By Expires   Diet - low sodium heart healthy  As directed    Increase activity slowly  As directed        Medication List    STOP taking these medications       dicyclomine 10 MG capsule  Commonly known as:  BENTYL     pantoprazole 40 MG tablet  Commonly known as:  PROTONIX       vitamin C 500 MG tablet  Commonly known as:  ASCORBIC ACID     ZOSTAVAX 91478 UNT/0.65ML injection  Generic drug:  zoster vaccine live (PF)      TAKE these medications       albuterol (2.5 MG/3ML) 0.083% nebulizer solution  Commonly known as:  PROVENTIL  Take 3 mLs (2.5 mg total) by nebulization every 2 (two) hours as needed for wheezing.     cyanocobalamin 1000 MCG/ML injection  Commonly known as:  (VITAMIN B-12)  Inject 1,000 mcg into the muscle every 3 (three) months.     furosemide 10 MG/ML injection  Commonly known as:  LASIX  Inject 2 mLs (20 mg total) into the vein once.     hydrocortisone sodium succinate 100 mg/2 mL injection  Commonly known as:  SOLU-CORTEF  Inject 2 mLs (100 mg total) into the vein every 6 (six) hours.     lactase 3000 UNITS tablet  Commonly known as:  LACTAID  Take 1 tablet by mouth daily as needed.     LORazepam 0.5 MG tablet  Commonly known as:  ATIVAN  Take 1 tablet (0.5 mg total) by mouth every 12 (twelve) hours as needed for anxiety.     morphine 2 MG/ML injection  Inject 1 mL (2 mg total) into the vein stat.     multivitamin with minerals Tabs tablet  Take 1 tablet by mouth daily.     ondansetron 4 MG tablet  Commonly known as:  ZOFRAN  Take 1 tablet (4 mg total) by mouth every 6 (six) hours as needed for nausea.       No Known Allergies    The results of significant diagnostics from this hospitalization (including imaging, microbiology, ancillary and laboratory) are listed below for reference.    Significant Diagnostic Studies: No results found.  Microbiology: No results found for this or any previous visit (from the past 240 hour(s)).   Labs: Basic Metabolic Panel:  Recent Labs Lab 02/22/13 0156  NA 133*  K 3.7  CL 97  CO2 23  GLUCOSE 105*  BUN 16  CREATININE 0.79  CALCIUM 8.2*   Liver Function Tests:  Recent Labs Lab 02/22/13 0156  AST 18  ALT 12  ALKPHOS 189*  BILITOT 2.8*  PROT 7.4  ALBUMIN  2.3*   No results found for this basename: LIPASE, AMYLASE,  in the last 168 hours No results found for this basename: AMMONIA,  in the last 168 hours CBC:  Recent Labs Lab 02/22/13 0156 02/22/13 0915 02/22/13 1740  WBC 2.6* 1.6* 4.3  NEUTROABS 1.3* 0.8* 3.4  HGB 7.7* 6.7* 8.4*  HCT 23.3* 20.4* 23.9*  MCV 108.4* 107.9* 104.8*  PLT <5* <5* <5*   Cardiac Enzymes: No results found for this basename: CKTOTAL, CKMB, CKMBINDEX, TROPONINI,  in the last 168 hours BNP: BNP (last 3 results) No results found for this basename: PROBNP,  in the last 8760 hours CBG: No results found for this basename: GLUCAP,  in the last 168 hours     Signed:  Nita Sells  [facility] 03-07-13, 8:54 AM

## 2013-03-26 DEATH — deceased

## 2013-03-31 ENCOUNTER — Other Ambulatory Visit (HOSPITAL_COMMUNITY): Payer: Medicare Other

## 2013-05-16 NOTE — Progress Notes (Signed)
PT PASSED JAN 2015.

## 2014-07-16 IMAGING — CT CT BIOPSY
4 series · 8 of 16 positions shown, 11 images · non-contrast
Comparison: none

Clinical Data/Indication: MYELODYSPLASTIC SYNDROME

CT-GUIDED BONE MARROW BIOPSY.  ASPIRATE AND CORE.
Sedation: Versed 2.5 mg, Fentanyl 150 mcg.
Total Moderate Sedation Time: 10 minutes.
Procedure: The procedure, risks, benefits, and alternatives were
explained to the patient. Questions regarding the procedure were
encouraged and answered. The patient understands and consents to
the procedure.
The low back was prepped with betadine in a sterile fashion, and a
sterile drape was applied covering the operative field. A sterile
gown and sterile gloves were used for the procedure.
Under CT guidance, 11 gauge needle was inserted into the right
iliac bone via posterior approach.  Aspirates and a core were
obtained. Final imaging was performed.
Patient tolerated the procedure well without complication.  Vital
sign monitoring by nursing staff during the procedure will continue
as patient is in the special procedures unit for post procedure
observation.

[Series 3: add scan 5.0 b70f · axial · 0.74mm/px · z∈[+986,+991]mm · 2 of 4 slices shown, 5 images (1 of 4)]
[im 2/4  soft-tissue]
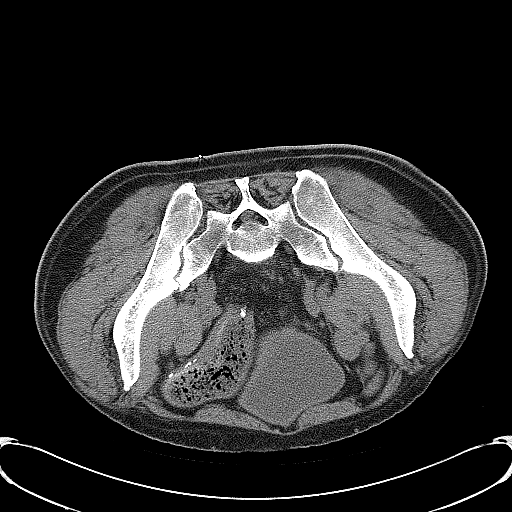
[im 2/4  lung]
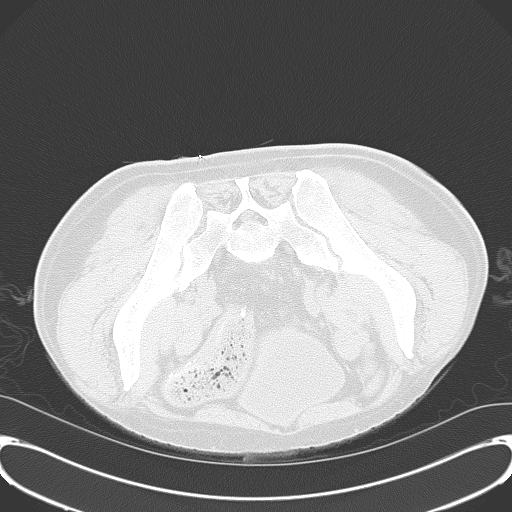
[im 2/4  bone]
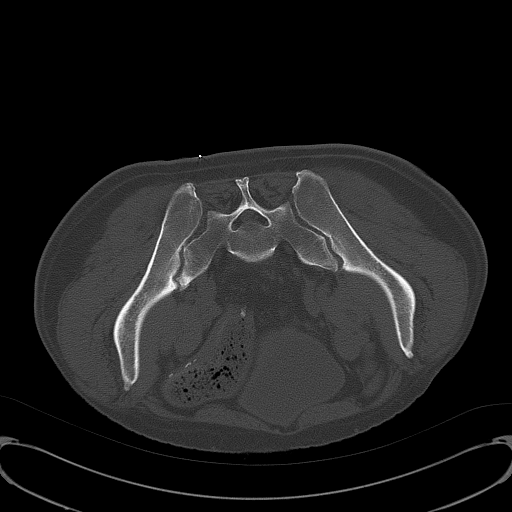
[im 3/4  soft-tissue]
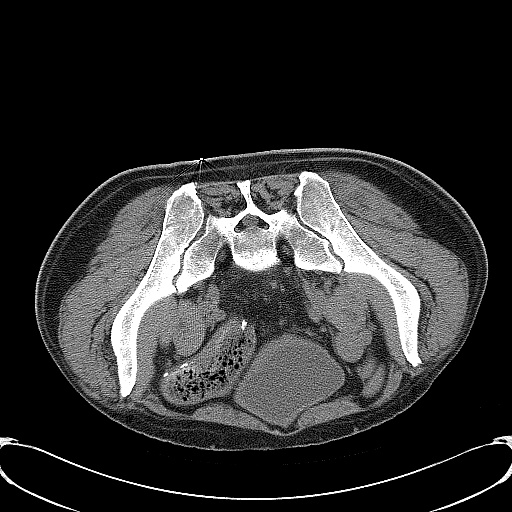
[im 3/4  lung]
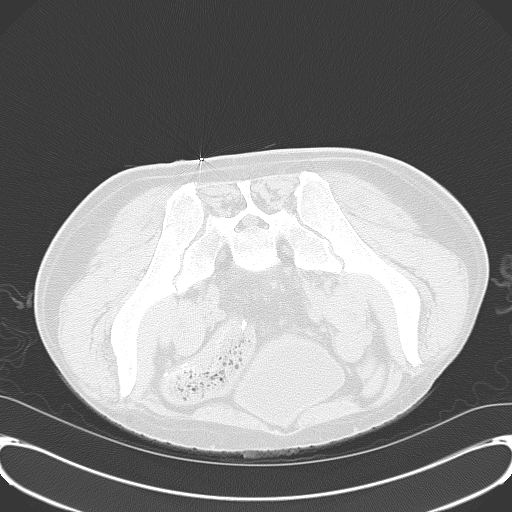

[Series 4: add scan 5.0 b70f · axial · 0.74mm/px · z∈[+986,+991]mm · 2 of 4 slices shown (2 of 4)]
[im 2/4  soft-tissue]
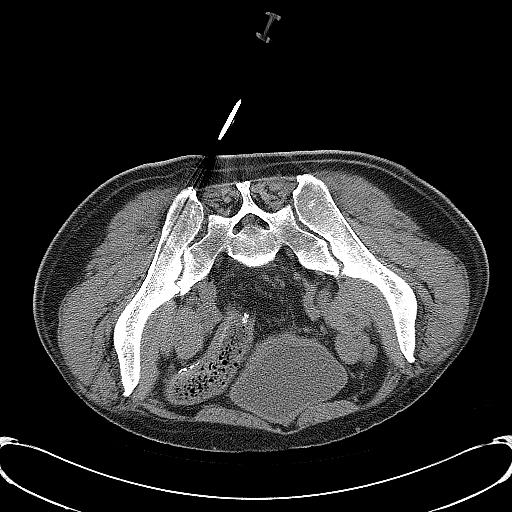
[im 3/4  soft-tissue]
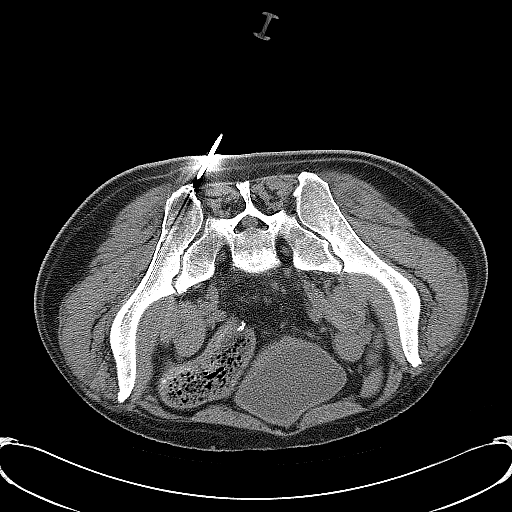

[Series 5: add scan 5.0 b70f · axial · 0.74mm/px · z∈[+993,+998]mm · 2 of 4 slices shown (3 of 4)]
[im 2/4  soft-tissue]
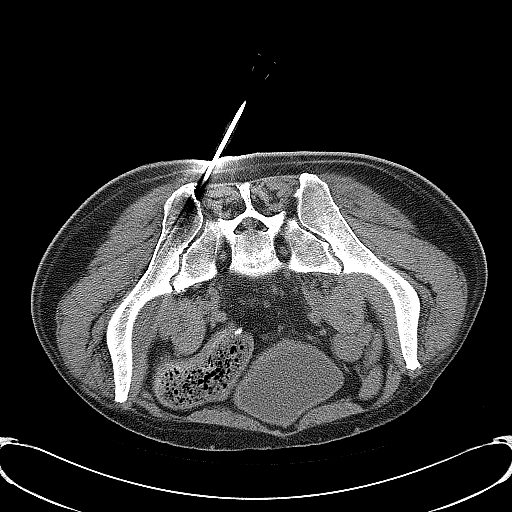
[im 3/4  soft-tissue]
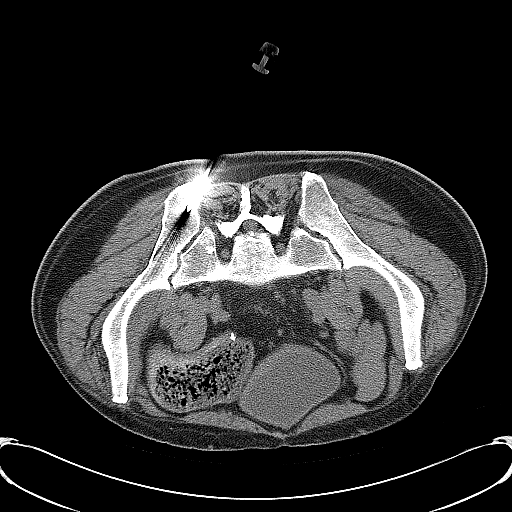

[Series 6: add scan 5.0 b70f · axial · 0.74mm/px · z∈[+993,+998]mm · 2 of 4 slices shown (4 of 4)]
[im 2/4  soft-tissue]
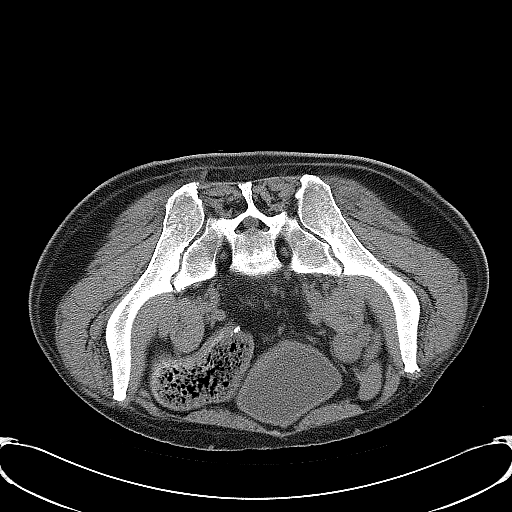
[im 3/4  soft-tissue]
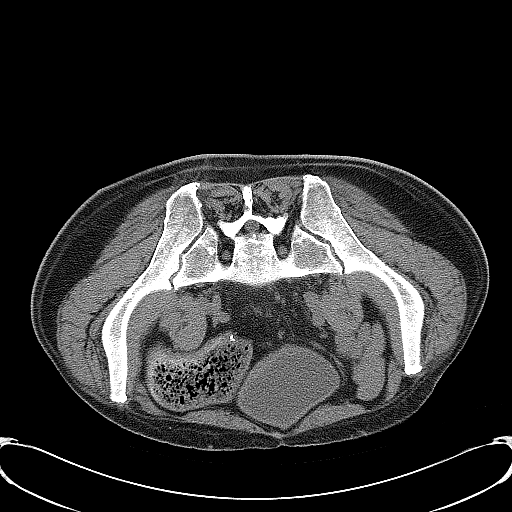

[8 of 16 positions shown; findings below may reference images not displayed]

FINDINGS: The images document guide needle placement within the
right iliac bone via posterior approach.  Post biopsy images
demonstrate no evidence of hemorrhage.
IMPRESSION: Successful CT-guided right iliac bone marrow aspirate and core.

## 2014-09-14 NOTE — Progress Notes (Signed)
REVIEWED.
